# Patient Record
Sex: Male | Born: 1960 | Race: White | Hispanic: No | Marital: Married | Smoking: Current every day smoker
Health system: Southern US, Community
[De-identification: ages and names within clinical notes are randomized; demographics above are authoritative.]

## PROBLEM LIST (undated history)

## (undated) ENCOUNTER — Ambulatory Visit (HOSPITAL_COMMUNITY)

## (undated) DIAGNOSIS — E079 Disorder of thyroid, unspecified: Secondary | ICD-10-CM

## (undated) DIAGNOSIS — J439 Emphysema, unspecified: Secondary | ICD-10-CM

## (undated) DIAGNOSIS — E785 Hyperlipidemia, unspecified: Secondary | ICD-10-CM

## (undated) HISTORY — DX: Hyperlipidemia, unspecified: E78.5

## (undated) HISTORY — DX: Disorder of thyroid, unspecified: E07.9

## (undated) HISTORY — PX: KNEE SURGERY: SHX244

---

## 2002-02-06 ENCOUNTER — Encounter: Admission: RE | Admit: 2002-02-06 | Discharge: 2002-02-06 | Payer: Self-pay | Admitting: Family Medicine

## 2002-02-06 ENCOUNTER — Encounter: Payer: Self-pay | Admitting: Family Medicine

## 2006-07-13 ENCOUNTER — Emergency Department (HOSPITAL_COMMUNITY): Admission: EM | Admit: 2006-07-13 | Discharge: 2006-07-13 | Payer: Self-pay | Admitting: Family Medicine

## 2006-09-14 ENCOUNTER — Ambulatory Visit: Payer: Self-pay | Admitting: Family Medicine

## 2006-10-10 ENCOUNTER — Ambulatory Visit: Payer: Self-pay | Admitting: Sports Medicine

## 2006-11-20 ENCOUNTER — Ambulatory Visit: Payer: Self-pay | Admitting: Family Medicine

## 2006-12-03 ENCOUNTER — Encounter: Payer: Self-pay | Admitting: Family Medicine

## 2006-12-03 ENCOUNTER — Ambulatory Visit: Payer: Self-pay | Admitting: Family Medicine

## 2006-12-03 LAB — CONVERTED CEMR LAB: TSH: 1.118 microintl units/mL (ref 0.350–5.50)

## 2007-02-28 ENCOUNTER — Telehealth: Payer: Self-pay | Admitting: *Deleted

## 2007-03-01 ENCOUNTER — Ambulatory Visit: Payer: Self-pay | Admitting: Family Medicine

## 2007-07-10 ENCOUNTER — Encounter (INDEPENDENT_AMBULATORY_CARE_PROVIDER_SITE_OTHER): Payer: Self-pay | Admitting: Family Medicine

## 2007-07-23 ENCOUNTER — Telehealth (INDEPENDENT_AMBULATORY_CARE_PROVIDER_SITE_OTHER): Payer: Self-pay | Admitting: Family Medicine

## 2007-08-28 ENCOUNTER — Ambulatory Visit: Payer: Self-pay | Admitting: Family Medicine

## 2007-08-28 ENCOUNTER — Encounter (INDEPENDENT_AMBULATORY_CARE_PROVIDER_SITE_OTHER): Payer: Self-pay | Admitting: Family Medicine

## 2007-08-28 DIAGNOSIS — E039 Hypothyroidism, unspecified: Secondary | ICD-10-CM | POA: Insufficient documentation

## 2007-08-28 DIAGNOSIS — E782 Mixed hyperlipidemia: Secondary | ICD-10-CM | POA: Insufficient documentation

## 2007-08-28 DIAGNOSIS — E7800 Pure hypercholesterolemia, unspecified: Secondary | ICD-10-CM | POA: Insufficient documentation

## 2007-08-28 DIAGNOSIS — F17210 Nicotine dependence, cigarettes, uncomplicated: Secondary | ICD-10-CM | POA: Insufficient documentation

## 2007-08-28 LAB — CONVERTED CEMR LAB
ALT: 14 units/L (ref 0–53)
AST: 17 units/L (ref 0–37)
Albumin: 4.3 g/dL (ref 3.5–5.2)
Alkaline Phosphatase: 89 units/L (ref 39–117)
BUN: 7 mg/dL (ref 6–23)
CO2: 25 meq/L (ref 19–32)
Calcium: 9.3 mg/dL (ref 8.4–10.5)
Chloride: 104 meq/L (ref 96–112)
Cholesterol: 241 mg/dL — ABNORMAL HIGH (ref 0–200)
Creatinine, Ser: 0.71 mg/dL (ref 0.40–1.50)
Glucose, Bld: 89 mg/dL (ref 70–99)
HCT: 42.5 % (ref 39.0–52.0)
HDL: 36 mg/dL — ABNORMAL LOW (ref >39)
Hemoglobin: 14.2 g/dL (ref 13.0–17.0)
LDL Cholesterol: 164 mg/dL — ABNORMAL HIGH (ref 0–99)
MCHC: 33.4 g/dL (ref 30.0–36.0)
MCV: 85.7 fL (ref 78.0–100.0)
Platelets: 254 10*3/uL (ref 150–400)
Potassium: 4.1 meq/L (ref 3.5–5.3)
RBC: 4.96 M/uL (ref 4.22–5.81)
RDW: 12.8 % (ref 11.5–14.0)
Sodium: 141 meq/L (ref 135–145)
TSH: 0.593 microintl units/mL (ref 0.350–5.50)
Total Bilirubin: 0.9 mg/dL (ref 0.3–1.2)
Total CHOL/HDL Ratio: 6.7
Total Protein: 6.7 g/dL (ref 6.0–8.3)
Triglycerides: 204 mg/dL — ABNORMAL HIGH (ref <150)
VLDL: 41 mg/dL — ABNORMAL HIGH (ref 0–40)
WBC: 7.6 10*3/uL (ref 4.0–10.5)

## 2007-08-29 ENCOUNTER — Telehealth: Payer: Self-pay | Admitting: *Deleted

## 2007-08-29 ENCOUNTER — Encounter (INDEPENDENT_AMBULATORY_CARE_PROVIDER_SITE_OTHER): Payer: Self-pay | Admitting: Family Medicine

## 2007-09-03 ENCOUNTER — Telehealth (INDEPENDENT_AMBULATORY_CARE_PROVIDER_SITE_OTHER): Payer: Self-pay | Admitting: Family Medicine

## 2007-12-04 ENCOUNTER — Ambulatory Visit: Payer: Self-pay | Admitting: Family Medicine

## 2007-12-04 ENCOUNTER — Encounter (INDEPENDENT_AMBULATORY_CARE_PROVIDER_SITE_OTHER): Payer: Self-pay | Admitting: Family Medicine

## 2007-12-04 LAB — CONVERTED CEMR LAB
Cholesterol, target level: 200 mg/dL
Cholesterol: 293 mg/dL — ABNORMAL HIGH (ref 0–200)
HDL goal, serum: 40 mg/dL
HDL: 41 mg/dL (ref >39)
LDL Cholesterol: 181 mg/dL — ABNORMAL HIGH (ref 0–99)
LDL Goal: 130 mg/dL
Total CHOL/HDL Ratio: 7.1
Triglycerides: 357 mg/dL — ABNORMAL HIGH (ref <150)
VLDL: 71 mg/dL — ABNORMAL HIGH (ref 0–40)

## 2007-12-05 ENCOUNTER — Encounter (INDEPENDENT_AMBULATORY_CARE_PROVIDER_SITE_OTHER): Payer: Self-pay | Admitting: Family Medicine

## 2007-12-10 ENCOUNTER — Telehealth (INDEPENDENT_AMBULATORY_CARE_PROVIDER_SITE_OTHER): Payer: Self-pay | Admitting: Family Medicine

## 2008-01-02 ENCOUNTER — Encounter (INDEPENDENT_AMBULATORY_CARE_PROVIDER_SITE_OTHER): Payer: Self-pay | Admitting: Family Medicine

## 2008-01-03 ENCOUNTER — Encounter (INDEPENDENT_AMBULATORY_CARE_PROVIDER_SITE_OTHER): Payer: Self-pay | Admitting: *Deleted

## 2008-02-25 ENCOUNTER — Ambulatory Visit: Payer: Self-pay | Admitting: Family Medicine

## 2008-03-31 ENCOUNTER — Telehealth: Payer: Self-pay | Admitting: *Deleted

## 2008-10-13 ENCOUNTER — Ambulatory Visit: Payer: Self-pay | Admitting: Family Medicine

## 2008-10-13 ENCOUNTER — Telehealth: Payer: Self-pay | Admitting: *Deleted

## 2008-10-14 ENCOUNTER — Encounter (INDEPENDENT_AMBULATORY_CARE_PROVIDER_SITE_OTHER): Payer: Self-pay | Admitting: *Deleted

## 2008-10-14 ENCOUNTER — Telehealth: Payer: Self-pay | Admitting: Family Medicine

## 2008-10-16 ENCOUNTER — Encounter: Payer: Self-pay | Admitting: Family Medicine

## 2008-10-16 ENCOUNTER — Telehealth (INDEPENDENT_AMBULATORY_CARE_PROVIDER_SITE_OTHER): Payer: Self-pay | Admitting: *Deleted

## 2009-01-01 ENCOUNTER — Telehealth: Payer: Self-pay | Admitting: Family Medicine

## 2009-02-09 ENCOUNTER — Ambulatory Visit: Payer: Self-pay | Admitting: Family Medicine

## 2009-02-09 ENCOUNTER — Encounter: Payer: Self-pay | Admitting: Family Medicine

## 2009-02-09 LAB — CONVERTED CEMR LAB
ALT: 14 units/L (ref 0–53)
AST: 16 units/L (ref 0–37)
Albumin: 4.1 g/dL (ref 3.5–5.2)
Alkaline Phosphatase: 101 units/L (ref 39–117)
BUN: 9 mg/dL (ref 6–23)
CO2: 27 meq/L (ref 19–32)
Calcium: 9 mg/dL (ref 8.4–10.5)
Chloride: 105 meq/L (ref 96–112)
Cholesterol: 260 mg/dL — ABNORMAL HIGH (ref 0–200)
Creatinine, Ser: 0.76 mg/dL (ref 0.40–1.50)
Glucose, Bld: 91 mg/dL (ref 70–99)
HDL: 38 mg/dL — ABNORMAL LOW (ref >39)
LDL Cholesterol: 175 mg/dL — ABNORMAL HIGH (ref 0–99)
Potassium: 4.4 meq/L (ref 3.5–5.3)
Sodium: 144 meq/L (ref 135–145)
TSH: 0.346 microintl units/mL — ABNORMAL LOW (ref 0.350–4.500)
Total Bilirubin: 0.7 mg/dL (ref 0.3–1.2)
Total CHOL/HDL Ratio: 6.8
Total Protein: 6.7 g/dL (ref 6.0–8.3)
Triglycerides: 233 mg/dL — ABNORMAL HIGH (ref <150)
VLDL: 47 mg/dL — ABNORMAL HIGH (ref 0–40)

## 2009-02-12 ENCOUNTER — Ambulatory Visit: Payer: Self-pay | Admitting: Family Medicine

## 2009-02-12 DIAGNOSIS — H9319 Tinnitus, unspecified ear: Secondary | ICD-10-CM | POA: Insufficient documentation

## 2009-04-28 ENCOUNTER — Emergency Department (HOSPITAL_COMMUNITY): Admission: EM | Admit: 2009-04-28 | Discharge: 2009-04-28 | Payer: Self-pay | Admitting: Emergency Medicine

## 2009-10-25 ENCOUNTER — Ambulatory Visit: Payer: Self-pay | Admitting: Family Medicine

## 2009-10-25 ENCOUNTER — Encounter: Payer: Self-pay | Admitting: Family Medicine

## 2009-10-25 LAB — CONVERTED CEMR LAB
ALT: 20 units/L (ref 0–53)
AST: 18 units/L (ref 0–37)
Albumin: 4.4 g/dL (ref 3.5–5.2)
Alkaline Phosphatase: 86 units/L (ref 39–117)
BUN: 9 mg/dL (ref 6–23)
CO2: 28 meq/L (ref 19–32)
Calcium: 9.8 mg/dL (ref 8.4–10.5)
Chloride: 105 meq/L (ref 96–112)
Creatinine, Ser: 0.73 mg/dL (ref 0.40–1.50)
Direct LDL: 198 mg/dL — ABNORMAL HIGH
Glucose, Bld: 86 mg/dL (ref 70–99)
HCT: 44.1 % (ref 39.0–52.0)
Hemoglobin: 14.8 g/dL (ref 13.0–17.0)
MCHC: 33.6 g/dL (ref 30.0–36.0)
MCV: 85 fL (ref 78.0–100.0)
Platelets: 243 10*3/uL (ref 150–400)
Potassium: 4.5 meq/L (ref 3.5–5.3)
RBC: 5.19 M/uL (ref 4.22–5.81)
RDW: 12.7 % (ref 11.5–15.5)
Sodium: 143 meq/L (ref 135–145)
TSH: 0.255 microintl units/mL — ABNORMAL LOW (ref 0.350–4.500)
Total Bilirubin: 0.9 mg/dL (ref 0.3–1.2)
Total Protein: 6.7 g/dL (ref 6.0–8.3)
WBC: 8.7 10*3/uL (ref 4.0–10.5)

## 2009-10-26 ENCOUNTER — Encounter: Payer: Self-pay | Admitting: Family Medicine

## 2009-11-24 ENCOUNTER — Telehealth: Payer: Self-pay | Admitting: Family Medicine

## 2010-03-08 ENCOUNTER — Ambulatory Visit: Payer: Self-pay | Admitting: Family Medicine

## 2010-05-03 ENCOUNTER — Ambulatory Visit: Payer: Self-pay | Admitting: Family Medicine

## 2010-05-03 ENCOUNTER — Encounter: Payer: Self-pay | Admitting: Family Medicine

## 2010-05-03 DIAGNOSIS — R319 Hematuria, unspecified: Secondary | ICD-10-CM | POA: Insufficient documentation

## 2010-05-03 LAB — CONVERTED CEMR LAB
Bilirubin Urine: NEGATIVE
Blood in Urine, dipstick: NEGATIVE
Glucose, Urine, Semiquant: NEGATIVE
Ketones, urine, test strip: NEGATIVE
Nitrite: NEGATIVE
Protein, U semiquant: NEGATIVE
Specific Gravity, Urine: 1.01
TSH: 0.139 microintl units/mL — ABNORMAL LOW (ref 0.350–4.500)
Urobilinogen, UA: 0.2
WBC Urine, dipstick: NEGATIVE
pH: 6.5

## 2010-05-04 ENCOUNTER — Encounter: Payer: Self-pay | Admitting: Family Medicine

## 2010-05-04 ENCOUNTER — Telehealth: Payer: Self-pay | Admitting: Family Medicine

## 2010-11-29 NOTE — Letter (Signed)
Summary: Out of Work  [facility]  [address]   [place], [place] [address]   Phone: [phone]  Fax: [phone]    Mar 08, 2010   Employee:  Joshua Mcknight    To Whom It May Concern:   Mr. Joshua Mcknight was seen in this office today for a medical illness. He is advised to remain out of work until Thursday, May 12th, when he may resume all his usual activities (including work).  If you need additional information, please feel free to contact our office.         Sincerely,    Paula Compton MD

## 2010-11-29 NOTE — Miscellaneous (Signed)
  Clinical Lists Changes  Medications: Changed medication from SYNTHROID 125 MCG TABS (LEVOTHYROXINE SODIUM) Take 1 tablet by mouth once a day [BMN] to SYNTHROID 100 MCG TABS (LEVOTHYROXINE SODIUM) Take 1 tab by mouth daily - Signed Rx of SYNTHROID 100 MCG TABS (LEVOTHYROXINE SODIUM) Take 1 tab by mouth daily;  #30 x 3;  Signed;  Entered by: Angelena Sole MD;  Authorized by: Angelena Sole MD;  Method used: Electronically to [facility] [id number], [place], [place], [place]  [address], Ph: [phone], Fax: [phone]    Prescriptions: SYNTHROID 100 MCG TABS (LEVOTHYROXINE SODIUM) Take 1 tab by mouth daily  #30 x 3   Entered and Authorized by:   Angelena Sole MD   Signed by:   Angelena Sole MD on 05/04/2010   Method used:   Electronically to        [facility] [id number] (retail)       [address]       [place], [place]  [address]       Ph: [phone]       Fax: [phone]   RxID:   [id number]

## 2010-11-29 NOTE — Progress Notes (Signed)
Summary: triage  Phone Note Call from Patient Call back at Home Phone [phone]   Caller: spouse-Donna Summary of Call: Wife says that she just got back from the Dr. and they suggested that her husband's Dr. call in [place] as a precaution for him. Initial call taken by: Clydell Hakim,  November 24, 2009 9:37 AM  Follow-up for Phone Call        uses [facility] [address]. she has the flu .started with HA monday. Dr. Larina Bras suggested her husband get Tamiflu. he woke up with a HA today. he went to work. asked that his pcp do this Follow-up by: Golden Circle RN,  November 24, 2009 9:45 AM  Additional Follow-up for Phone Call Additional follow up Details #1::        Spoke with pt's wife. She has flu. Husband has early symptoms including headache and congestion. Given exposure will treat.  Additional Follow-up by: Myrtie Soman  MD,  November 24, 2009 10:58 AM    New/Updated Medications: TAMIFLU 75 MG CAPS (OSELTAMIVIR PHOSPHATE) one by mouth daily for 10 days Prescriptions: TAMIFLU 75 MG CAPS (OSELTAMIVIR PHOSPHATE) one by mouth daily for 10 days  #10 x 0   Entered and Authorized by:   Myrtie Soman  MD   Signed by:   Myrtie Soman  MD on 11/24/2009   Method used:   Electronically to        [facility] [id number] (retail)       [address] [place], [place]  [address]       Ph: [phone]       Fax: [phone]   RxID:   [id number]

## 2010-11-29 NOTE — Assessment & Plan Note (Signed)
Summary: flu symptoms,tcb   Vital Signs:  Patient profile:   50 year old male Height:      70.5 inches Weight:      154 pounds BMI:     21.86 Temp:     98.4 degrees F oral Pulse rate:   81 / minute BP sitting:   107 / 78  (right arm) Cuff size:   regular  Vitals Entered By: Tessie Fass CMA (Mar 08, 2010 12:00 PM) CC: Flu symptoms x 2 days Is Patient Diabetic? No Pain Assessment Patient in pain? no        Primary Care Provider:  Myrtie Soman  MD  CC:  Flu symptoms x 2 days.  History of Present Illness: Mr Till comes in today for acute visit, complaint of "flu like symptoms" including backache, mild headache, and fever to 102F since Sunday, May 8th .  Started when he woke up on 5/08, gradually has gotten worse.  Similar sxs occur about once a year for him, and then resolve over several days.  Usually this happens in the fall. In reviewing his notes, I see he was prescribed Tamiflu over the phone in Jan of this year; he has little  recollection of that episode of illness.   Describes main symptoms as soreness and backache; has had some watery runny nose, but not much in the way of cough except for once-daily clearning of throat when he wakes up.   Continues to smoke about 1 ppd, has taken Chantix in the past but did not find it very helpful.    Sick contacts: mother with bronchitis, he saw her on Saturday (the day before the beginning of this episode of illness).  Habits & Providers  Alcohol-Tobacco-Diet     Tobacco Status: current     Tobacco Counseling: to quit use of tobacco products     Cigarette Packs/Day: 1.0  Current Medications (verified): 1)  Synthroid 125 Mcg Tabs (Levothyroxine Sodium) .... Take 1 Tablet By Mouth Once A Day 2)  Pravastatin Sodium 40 Mg Tabs (Pravastatin Sodium) .Marland Kitchen.. 1 By Mouth Once Daily For Cholesterol. 3)  Tamiflu 75 Mg Caps (Oseltamivir Phosphate) .... One By Mouth Daily For 10 Days  Allergies (verified): No Known Drug  Allergies  Physical Exam  General:  Generally well appearing, no apparent distress Eyes:  clear sclerae. no injected conjunctivae Ears:  TMs clear bilaterally.  Nose:  No sinus tenderness.  Mildly watery nasal mucosa, no purulence or blood.  Mouth:  Moist buccal membranes.  Oropharynx without exudates, but with some cobblestoning Neck:  Neck supple.  No anterior cervical adenopathy.  Lungs:  Clear lung fields bilaterally. No rales or wheezes.  Heart:  Regular S1S2.  Abdomen:  Soft, nontender   Impression & Recommendations:  Problem # 1:  UPPER RESPIRATORY INFECTION, ACUTE (ICD-465.9)  Discussed likelihood that this is a self-limited viral illness; nothing to suggest that he would have pneumonia or a more severe infectious process.  Discussed expected course, supportive measures, and when he should seek additional care if not improved or if worsening.   Orders: [facility]- Est Level  3 ([address])  Problem # 2:  TOBACCO ABUSE (ICD-305.1)  Disussed reasons why tobacco cessation is advisable.  He is aware of this, counseled during this visit.  Orders: [facility]- Est Level  3 ([address])  Complete Medication List: 1)  Synthroid 125 Mcg Tabs (Levothyroxine sodium) .... Take 1 tablet by mouth once a day 2)  Pravastatin Sodium 40 Mg Tabs (Pravastatin sodium) .Marland KitchenMarland KitchenMarland Kitchen  1 by mouth once daily for cholesterol. 3)  Tamiflu 75 Mg Caps (Oseltamivir phosphate) .... One by mouth daily for 10 days  Patient Instructions: 1)  It was a pleasure to see you today.  I believe you have a viral respiratory infection, not the flu.  2)  I recommend that you take an anti-inflammatory medication, such as Ibuprofen 200mg  tabs (over-the-counter), take 2 to 4 tablets by mouth every 6 to 8 hours as needed for backache, headache, and fever.  I do not think you should need this for much more than a couple of days.  3)  You may use nasal saline spray ([facility] Spray), to irrigate your nose.  Also, Mucinex DM 1 tablet by mouth twice daily to  loosen phlegm, especially in the mornings.  4)  As always, we are glad to continue to discuss ways to help you quit smoking.  Smokers are more likely to get this kind of infection more often, and for longer.

## 2010-11-29 NOTE — Letter (Signed)
Summary: Generic Letter  [facility]  [address]   [place], [place] [address]   Phone: [phone]  Fax: [phone]    05/04/2010  Joshua Mcknight [address] [place], [place]  [address]  Dear [name],  Here is a copy of your lab results.  Your TSH is too low, so we need to decrease the Synthroid dose.  Please start taking instead of .  I have sent in a new prescription to your pharmacy.  Please call the office if you have any questions.  Tests: (1) TSH ([address])   TSH                  [L]  0.139 uIU/mL            Normal  0.350-4.500     Sincerely,   Angelena Sole MD  Appended Document: Generic Letter mailed

## 2010-11-29 NOTE — Progress Notes (Signed)
  Phone Note Outgoing Call   Call placed by: Angelena Sole MD,  May 04, 2010 1:43 PM Summary of Call: LM about lab results

## 2010-11-29 NOTE — Assessment & Plan Note (Signed)
Summary: brown urine,df   Vital Signs:  Patient profile:   50 year old male Height:      70.5 inches Weight:      157.2 pounds BMI:     22.32 Temp:     98.0 degrees F oral Pulse rate:   68 / minute BP sitting:   131 / 72  (left arm) Cuff size:   regular  Vitals Entered By: Garen Grams LPN (May 03, 1609 9:28 AM) CC: cloudy urine Is Patient Diabetic? No Pain Assessment Patient in pain? no        Primary Care Provider:  Myrtie Soman  MD  CC:  cloudy urine.  History of Present Illness: 1. Cloudy urine:  Pt had 2 days of "dark, cloudy" urine.  This was on Mon and Tues and since then it has cleared up.  This happened one other time last year in the summer.  He has been working hard for the past week.  He had a little bit of lower abdominal discomfort but no pain.      ROS: denies fevers, hematuria, dysuria, abdominal pain, back pain  2. Hypothyroidism:  Hasn't had TSH checked in a while.  He is taking the synthroid as prescribed.      ROS: endorses some fatigue  3. Tobacco Use:  He continues to smoke around 1 pack / day.  He has no interest in cutting down.  He is aware of the risks      ROS: denies chest pain, shortness of breath, chronic cough, chills  Habits & Providers  Alcohol-Tobacco-Diet     Tobacco Status: current     Tobacco Counseling: to quit use of tobacco products     Cigarette Packs/Day: 1.0  Current Medications (verified): 1)  Synthroid 125 Mcg Tabs (Levothyroxine Sodium) .... Take 1 Tablet By Mouth Once A Day 2)  Pravastatin Sodium 40 Mg Tabs (Pravastatin Sodium) .Marland Kitchen.. 1 By Mouth Once Daily For Cholesterol. 3)  Tamiflu 75 Mg Caps (Oseltamivir Phosphate) .... One By Mouth Daily For 10 Days  Allergies: No Known Drug Allergies  Past History:  Risk Factors: Smoking Status: current (05/03/2010) Packs/Day: 1.0 (05/03/2010)  Past Medical History: Reviewed history from 12/27/2006 and no changes required. ABI>1 B, complete thyroid ablation in distant  past, febrile sz as child, knee fx at 25 y/o, mult calluses on both feet  Social History: Reviewed history from 02/12/2009 and no changes required. 35 pack year tob. Rare Etoh, distant h/o cocaine, THC, acid.  No injection drug use.  Former Chartered certified accountant, now works for [employer].   Married to [employer].  Daughter in law and her child also live at home.  Remote ETOH use.   Physical Exam  General:  Generally well appearing, no apparent distress Head:  normocephalic and atraumatic.   Eyes:  clear sclerae. no injected conjunctivae Mouth:  fair dentition.   Neck:  Neck supple.  No anterior cervical adenopathy.  no masses Lungs:  Clear lung fields bilaterally. No rales or wheezes.  Heart:  Regular S1S2.  Abdomen:  Soft, nontender.  Normal bowel sounds.  no guarding or rebound tenderness. Extremities:  no cyanosis, clubbing, or edema  Skin:  turgor normal, color normal, no rashes, and no suspicious lesions.   Psych:  good eye contact, not anxious appearing, and not depressed appearing.     Impression & Recommendations:  Problem # 1:  HEMATURIA UNSPECIFIED (ICD-599.70) Assessment Unchanged No signs of blood or infection in his urine.  Advised him  to collect it if it happens again for sampling. 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 His updated medication list for this problem includes:    Synthroid 125 Mcg Tabs (Levothyroxine sodium) .Marland Kitchen... Take 1 tablet by mouth once a day  Orders: TSH-[facility] ([address]) [facility]- Est  Level 4 ([address])  Problem # 3:  TOBACCO ABUSE (ICD-305.1) Assessment: Unchanged  Advised him to quit smoking.  Not interested at this point.  Orders: [facility]- Est  Level 4 ([address])  Complete Medication List: 1)  Synthroid 125 Mcg Tabs (Levothyroxine sodium) .... Take 1 tablet by mouth once a day  Laboratory Results   Urine Tests  Date/Time  Received: May 03, 2010 9:26 AM  Date/Time Reported: May 03, 2010 9:35 AM   Routine Urinalysis   Color: straw Appearance: Clear Glucose: negative   (Normal Range: Negative) Bilirubin: negative   (Normal Range: Negative) Ketone: negative   (Normal Range: Negative) Spec. Gravity: 1.010   (Normal Range: 1.003-1.035) Blood: negative   (Normal Range: Negative) pH: 6.5   (Normal Range: 5.0-8.0) Protein: negative   (Normal Range: Negative) Urobilinogen: 0.2   (Normal Range: 0-1) Nitrite: negative   (Normal Range: Negative) Leukocyte Esterace: negative   (Normal Range: Negative)    Comments: ...............test performed by......Marland KitchenBonnie A. [place], MLS (ASCP)cm     Prevention & Chronic Care Immunizations   Influenza vaccine: Not documented    Tetanus booster: 09/05/2006: Done.   Tetanus booster due: 09/05/2016    Pneumococcal vaccine: Pneumovax  (08/28/2007)   Pneumococcal vaccine due: None  Colorectal Screening   Hemoccult: Not documented    Colonoscopy: Not documented  Other Screening   PSA: Not documented   Smoking status: current  (05/03/2010)   Smoking cessation counseling: yes  (05/03/2010)  Lipids   Total Cholesterol: 260  (02/09/2009)   LDL: 175  (02/09/2009)   LDL Direct: 198  (10/25/2009)   HDL: 38  (02/09/2009)   Triglycerides: 233  (02/09/2009)    SGOT (AST): 18  (10/25/2009)   SGPT (ALT): 20  (10/25/2009)   Alkaline phosphatase: 86  (10/25/2009)   Total bilirubin: 0.9  (10/25/2009)    Lipid flowsheet reviewed?: Yes   Progress toward LDL goal: Unchanged  Self-Management Support :   Personal Goals (by the next clinic visit) :      Personal LDL goal: 160  (10/25/2009)    Lipid self-management support: Written self-care plan, Education handout  (10/25/2009)     Lipid self-management support not done because: Refused  (10/25/2009)

## 2010-12-13 ENCOUNTER — Ambulatory Visit (INDEPENDENT_AMBULATORY_CARE_PROVIDER_SITE_OTHER): Payer: Managed Care, Other (non HMO) | Admitting: Family Medicine

## 2010-12-13 ENCOUNTER — Encounter: Payer: Self-pay | Admitting: Family Medicine

## 2010-12-13 VITALS — BP 115/74 | HR 73 | Temp 98.0°F | Wt 156.0 lb

## 2010-12-13 DIAGNOSIS — H10029 Other mucopurulent conjunctivitis, unspecified eye: Secondary | ICD-10-CM | POA: Insufficient documentation

## 2010-12-13 MED ORDER — POLYMYXIN B-TRIMETHOPRIM 10000-0.1 UNIT/ML-% OP SOLN: 2.0000 [drp] | Freq: Four times a day (QID) | OPHTHALMIC | Status: AC

## 2010-12-13 NOTE — Assessment & Plan Note (Signed)
Does not look like significant drainage currently but given granddaughter exposure and unable to go to work until cleared up, will give abx today.  Gave red flags for RTC.

## 2010-12-13 NOTE — Patient Instructions (Signed)
It was nice to meet you.  Please come back if your eye gets worse or you have any vision changes or pain After 3 days, you can decrease the drops to 2 times a day if you see improvement

## 2010-12-13 NOTE — Progress Notes (Signed)
  Subjective:    Patient ID: Joshua Mcknight, male    DOB: December 25, 1960, 50 y.o.   MRN: [id number]  HPI Pink eye- woke up this am with crusted right eye.  Some irritation and redness.  Grand daughter dx with conjunctivitis and given abx yesterday.  ROS No vision changes, no HA, no eye pain.     Review of Systems     Objective:   Physical Exam  Constitutional: He appears well-developed and well-nourished.  Eyes: EOM are normal. Pupils are equal, round, and reactive to light. Right eye exhibits no discharge and no exudate. Right conjunctiva is injected.          Assessment & Plan:

## 2010-12-29 ENCOUNTER — Other Ambulatory Visit: Payer: Self-pay | Admitting: Family Medicine

## 2010-12-29 NOTE — Telephone Encounter (Signed)
Refill request

## 2011-09-12 ENCOUNTER — Other Ambulatory Visit: Payer: Self-pay | Admitting: Family Medicine

## 2011-09-12 NOTE — Telephone Encounter (Signed)
Called and left message on home voicemail to call office for appointment before next refill needed.  Refilled for one month.

## 2011-09-18 ENCOUNTER — Encounter: Payer: Self-pay | Admitting: Family Medicine

## 2011-09-18 ENCOUNTER — Ambulatory Visit (INDEPENDENT_AMBULATORY_CARE_PROVIDER_SITE_OTHER): Payer: Managed Care, Other (non HMO) | Admitting: Family Medicine

## 2011-09-18 VITALS — BP 120/80 | HR 72 | Temp 97.9°F | Ht 70.5 in | Wt 147.5 lb

## 2011-09-18 DIAGNOSIS — Z0000 Encounter for general adult medical examination without abnormal findings: Secondary | ICD-10-CM | POA: Insufficient documentation

## 2011-09-18 DIAGNOSIS — F17200 Nicotine dependence, unspecified, uncomplicated: Secondary | ICD-10-CM

## 2011-09-18 DIAGNOSIS — E7800 Pure hypercholesterolemia, unspecified: Secondary | ICD-10-CM

## 2011-09-18 DIAGNOSIS — E890 Postprocedural hypothyroidism: Secondary | ICD-10-CM

## 2011-09-18 LAB — TSH: TSH: 10.066 u[IU]/mL — ABNORMAL HIGH (ref 0.350–4.500)

## 2011-09-18 LAB — LIPID PANEL
Cholesterol: 262 mg/dL — ABNORMAL HIGH (ref 0–200)
HDL: 46 mg/dL (ref >39)
Total CHOL/HDL Ratio: 5.7 Ratio
Triglycerides: 122 mg/dL (ref <150)

## 2011-09-18 NOTE — Assessment & Plan Note (Signed)
Patient refuses colonoscopy and flu shot today.

## 2011-09-18 NOTE — Assessment & Plan Note (Signed)
Will check lipids today, discuss need for medication with patient when results are back.

## 2011-09-18 NOTE — Progress Notes (Signed)
  Subjective:    Patient ID: Joshua Mcknight, male    DOB: 03-14-1961, 49 y.o.   MRN: [id number]  HPI  Joshua Mcknight comes in to have his thyroid checked.  He says he has been on the same dose of synthroid for 20 years and is wondering if he could come just once a year to have it monitored instead of every 6 months.   He smokes about 1 ppd, and he says he has no interest in quitting.  He knows it may give him cancer but he says he believes when his time comes that is his time.    He says he had his cholesterol checked at work and it was better than the last time it was checked here.  He agrees to have that checked, but says if it is under 250 he's not worried about it and will refuse to take medicines.  Health maintenance- patient refuses colonoscopy, saying he had one 15 or 20 years ago and it was horrible.  He also refuses heme-occult stool cards, saying if he has cancer he's not going to let them operate on him anyway.  Patient also refuses the flu shot, saying he will get the flu if he gets the shot.   Review of Systems  Constitutional: Negative for unexpected weight change.  Eyes: Negative for visual disturbance.  Respiratory: Negative for shortness of breath.   Cardiovascular: Negative for chest pain.  Gastrointestinal: Negative for blood in stool.  Musculoskeletal: Positive for arthralgias.  Skin: Negative for rash.  Neurological: Negative for weakness.  Hematological: Negative for adenopathy.  Psychiatric/Behavioral: Negative for dysphoric mood.       Objective:   Physical Exam BP 120/80  Pulse 72  Temp(Src) 97.9 F (36.6 C) (Oral)  Ht 5' 10.5" (1.791 m)  Wt 147 lb 8 oz (66.906 kg)  BMI 20.87 kg/m2 General appearance: alert, cooperative and no distress Eyes: conjunctivae/corneas clear. PERRL, EOM's intact. Fundi benign. Throat: oral mucosa moist, no lesions, poor dentition.  Neck: no adenopathy, no JVD, supple, symmetrical, trachea midline and thyroid not enlarged, symmetric,  no tenderness/mass/nodules Lungs: clear to auscultation bilaterally Heart: regular rate and rhythm, S1, S2 normal, no murmur, click, rub or gallop Abdomen: soft, non-tender; bowel sounds normal; no masses,  no organomegaly Pulses: 2+ and symmetric Neurologic: Grossly normal       Assessment & Plan:

## 2011-09-18 NOTE — Assessment & Plan Note (Signed)
Patient wants to decrease frequency of monitoring.  Discussed that if his dose remains stable, he can follow up in one year, but if needs adjusting will need to follow up sooner.  He agrees.

## 2011-09-18 NOTE — Assessment & Plan Note (Signed)
Patient is in pre-contemplative stage for quitting.  He is not interested and accepts the risks smoking poses to his health.

## 2011-09-18 NOTE — Patient Instructions (Signed)
It was nice to meet you.  I will send you a letter or call you with your lab results.  If you have any problems, please call the office.  If your Synthroid dose remains stable, we will see you back in one year.  If this lab test shows we need to change the dose, we will need to see you again sooner.

## 2011-09-25 ENCOUNTER — Telehealth: Payer: Self-pay | Admitting: Family Medicine

## 2011-09-25 ENCOUNTER — Encounter: Payer: Self-pay | Admitting: Family Medicine

## 2011-09-25 MED ORDER — LEVOTHYROXINE SODIUM 150 MCG PO TABS: 150.0000 ug | ORAL_TABLET | Freq: Every day | ORAL | Status: DC

## 2011-09-25 NOTE — Telephone Encounter (Signed)
Called to notify pt that TSH high, needs increased dose of synthroid.  rx sent to pharmacy.  Also, cholesterol is high, advised diet and exercise, and said would discuss starting a mediation.  Pt is to follow up in 3 months.

## 2012-01-24 ENCOUNTER — Other Ambulatory Visit: Payer: Self-pay | Admitting: Family Medicine

## 2012-02-08 ENCOUNTER — Encounter: Payer: Self-pay | Admitting: Internal Medicine

## 2012-02-08 ENCOUNTER — Ambulatory Visit (INDEPENDENT_AMBULATORY_CARE_PROVIDER_SITE_OTHER): Payer: Managed Care, Other (non HMO) | Admitting: Family Medicine

## 2012-02-08 ENCOUNTER — Encounter: Payer: Self-pay | Admitting: Family Medicine

## 2012-02-08 VITALS — BP 104/65 | HR 70 | Temp 98.1°F | Ht 70.5 in | Wt 143.8 lb

## 2012-02-08 DIAGNOSIS — E890 Postprocedural hypothyroidism: Secondary | ICD-10-CM

## 2012-02-08 DIAGNOSIS — E7800 Pure hypercholesterolemia, unspecified: Secondary | ICD-10-CM

## 2012-02-08 DIAGNOSIS — R634 Abnormal weight loss: Secondary | ICD-10-CM

## 2012-02-08 LAB — CBC WITH DIFFERENTIAL/PLATELET
Basophils Absolute: 0.1 10*3/uL (ref 0.0–0.1)
Eosinophils Absolute: 0.2 10*3/uL (ref 0.0–0.7)
Eosinophils Relative: 2 % (ref 0–5)
HCT: 43.7 % (ref 39.0–52.0)
MCH: 27.8 pg (ref 26.0–34.0)
MCV: 86 fL (ref 78.0–100.0)
Monocytes Absolute: 0.6 10*3/uL (ref 0.1–1.0)
Platelets: 316 10*3/uL (ref 150–400)
RDW: 13.5 % (ref 11.5–15.5)

## 2012-02-08 MED ORDER — SIMVASTATIN 40 MG PO TABS: 40.0000 mg | ORAL_TABLET | Freq: Every day | ORAL | Status: DC

## 2012-02-08 NOTE — Assessment & Plan Note (Signed)
Patient overdue for colonoscopy.  GI referral made.  Will check CBC and TSH.

## 2012-02-08 NOTE — Patient Instructions (Signed)
It was good to see you.  I will send you a letter with your labs.  Please start taking simvastatin, the cholesterol medication daily.    I am referring you to Gastroenterology for your screening colonoscopy, the office will contact you with the appointment.

## 2012-02-08 NOTE — Assessment & Plan Note (Signed)
Re-check TSH to ensure new dose of Synthroid appropriate- ensure not over-treating (causing weight loss).

## 2012-02-08 NOTE — Assessment & Plan Note (Signed)
Discussed risks and benefits of statin therapy.  I strongly advised starting simvastatin, which he agreed to, and rx written.

## 2012-02-08 NOTE — Progress Notes (Signed)
  Subjective:    Patient ID: Joshua Mcknight, male    DOB: Sep 25, 1961, 51 y.o.   MRN: [id number]  HPI  Mr. Holte comes in for follow up.  His TSH was elevated last visit, although he had been on a stable dose of synthroid for many years.  He has started taking the new dose of synthroid.  However he does endorse some weight loss- but this has been going on for >6 months, before his thyroid medication was changed.  He denies palpitations, vision changes, chest pain, or dyspnea.   His cholesterol was also evevated (total  = 262, LDL 191).  Patient is very resistant to being on a cholesterol pill, because he took them years ago.   He says he has lost weight over the past few years unintentionally.  He denies fatigue, denies cough, dyspnea, blood in stool.  He had a colonoscopy about 20 years ago when he had blood in his stool and is resistant to having one done again.   Review of Systems Pertinent Items in HPI    Objective:   Physical Exam BP 104/65  Pulse 70  Temp(Src) 98.1 F (36.7 C) (Oral)  Ht 5' 10.5" (1.791 m)  Wt 143 lb 12.8 oz (65.227 kg)  BMI 20.34 kg/m2 General appearance: alert, cooperative and no distress Eyes: conjunctivae/corneas clear. PERRL, EOM's intact. Fundi benign. Throat: lips, mucosa, and tongue normal; teeth and gums normal Neck: no adenopathy, no carotid bruit, no JVD, supple, symmetrical, trachea midline and thyroid not enlarged, symmetric, no tenderness/mass/nodules Lungs: clear to auscultation bilaterally Heart: regular rate and rhythm, S1, S2 normal, no murmur, click, rub or gallop Abdomen: soft, non-tender; bowel sounds normal; no masses,  no organomegaly Extremities: extremities normal, atraumatic, no cyanosis or edema Pulses: 2+ and symmetric Skin: Skin color, texture, turgor normal. No rashes or lesions       Assessment & Plan:

## 2012-02-12 ENCOUNTER — Encounter: Payer: Self-pay | Admitting: Family Medicine

## 2012-02-26 ENCOUNTER — Other Ambulatory Visit: Payer: Self-pay | Admitting: Family Medicine

## 2012-02-26 MED ORDER — LEVOTHYROXINE SODIUM 150 MCG PO TABS: 150.0000 ug | ORAL_TABLET | Freq: Every day | ORAL | Status: DC

## 2012-02-27 ENCOUNTER — Ambulatory Visit: Payer: Managed Care, Other (non HMO) | Admitting: Internal Medicine

## 2012-08-25 ENCOUNTER — Encounter (HOSPITAL_COMMUNITY): Payer: Self-pay | Admitting: *Deleted

## 2012-08-25 ENCOUNTER — Emergency Department (INDEPENDENT_AMBULATORY_CARE_PROVIDER_SITE_OTHER)
Admission: EM | Admit: 2012-08-25 | Discharge: 2012-08-25 | Disposition: A | Discharge status: Home / Self Care | Payer: Managed Care, Other (non HMO) | Source: Home / Self Care

## 2012-08-25 DIAGNOSIS — E039 Hypothyroidism, unspecified: Secondary | ICD-10-CM

## 2012-08-25 DIAGNOSIS — S61411A Laceration without foreign body of right hand, initial encounter: Secondary | ICD-10-CM

## 2012-08-25 DIAGNOSIS — G4700 Insomnia, unspecified: Secondary | ICD-10-CM

## 2012-08-25 DIAGNOSIS — S61409A Unspecified open wound of unspecified hand, initial encounter: Secondary | ICD-10-CM

## 2012-08-25 MED ORDER — CLINDAMYCIN HCL 300 MG PO CAPS: 300.0000 mg | ORAL_CAPSULE | Freq: Three times a day (TID) | ORAL | Status: DC

## 2012-08-25 MED ORDER — IBUPROFEN 800 MG PO TABS: 800.0000 mg | ORAL_TABLET | Freq: Four times a day (QID) | ORAL | Status: DC | PRN

## 2012-08-25 MED ORDER — TETANUS-DIPHTHERIA TOXOIDS TD 5-2 LFU IM INJ
0.5000 mL | INJECTION | Freq: Once | INTRAMUSCULAR | Status: AC
  Administered 2012-08-25: 0.5 mL via INTRAMUSCULAR

## 2012-08-25 MED ORDER — TETANUS-DIPHTH-ACELL PERTUSSIS 5-2.5-18.5 LF-MCG/0.5 IM SUSP
INTRAMUSCULAR | Status: AC
  Filled 2012-08-25: qty 0.5

## 2012-08-25 NOTE — ED Notes (Signed)
Pt reports right hand laceration that occurred at 3  Am with box knife _ right thumb. Bloody dressing removed and hand placed in betadine soak

## 2012-08-25 NOTE — ED Provider Notes (Signed)
History     CSN: [id number]  Arrival date & time 08/25/12  0920   First MD Initiated Contact with Patient 08/25/12 1003      Chief Complaint  Patient presents with  . Laceration    HPI 51 year old Caucasian male with known history oftobacco abuse, post ablative hypothyroidism presented to the urgent care Center at 08/25/2012 after cutting himself while trying to insulate his windows at [facility] a box night. He states that this happened at 3 AM in the morning. He states he does not sleep well and has been under significant stress because his wife of 21 years recently left him. He states that he has had no fever no chills no nausea no vomiting. He states that the bleeding stopped after 20 minutes of use of toilet paper. He came to the urgent care except before the won't be reviewed and to make sure that there was no other interventions like suturing that was needed.  History reviewed. No pertinent past medical history.  History reviewed. No pertinent past surgical history.  Family History  Problem Relation Age of Onset  . Family history unknown: Yes    History  Substance Use Topics  . Smoking status: Current Every Day Smoker -- 2.0 packs/day for 32 years    Types: Cigarettes  . Smokeless tobacco: Not on file  . Alcohol Use: No      Review of Systems Insomnia positive, no chest pain or shortness of breath no nausea no vomiting No diarrhea Does not think he is anxious Does have some evidence of situational stress. Has not been sleeping because of this.  Allergies  Review of patient's allergies indicates no known allergies.  Home Medications   Current Outpatient Rx  Name Route Sig Dispense Refill  . LEVOTHYROXINE SODIUM 150 MCG PO TABS Oral Take 1 tablet (150 mcg total) by mouth daily. 30 tablet 5  . SIMVASTATIN 40 MG PO TABS Oral Take 1 tablet (40 mg total) by mouth at bedtime. 90 tablet 3    BP 140/82  Pulse 87  Temp 98.2 F (36.8 C) (Oral)  Resp 20  SpO2  96%  Physical Exam Pleasant alert oriented Caucasian male no apparent distress Extremely poor dentition S1-S2 no murmur rub At clinically clear chest Right hand over the thenar eminence about 3 cm from the proximal interphalangeal joint shows a 1 cm gash which is about a quarter centimeter deep. It oozes slightly onpressure however it has stopped bleeding and looks clean.  ED Course  LACERATION REPAIR Date/Time: 08/25/2012 10:30 AM Performed by: Rhetta Mura Authorized by: Rhetta Mura Consent: Verbal consent obtained. Risks and benefits: risks, benefits and alternatives were discussed Consent given by: patient Patient understanding: patient states understanding of the procedure being performed Patient consent: the patient's understanding of the procedure matches consent given Procedure consent: procedure consent matches procedure scheduled Relevant documents: relevant documents present and verified Test results: test results available and properly labeled Site marked: the operative site was marked Imaging studies: imaging studies available Patient identity confirmed: verbally with patient Body area: upper extremity Location details: right hand Laceration length: 1 cm Foreign bodies: no foreign bodies Tendon involvement: none Nerve involvement: none Vascular damage: no Anesthesia: see MAR for details Patient sedated: no Preparation: Patient was prepped and draped in the usual sterile fashion. Irrigation solution: saline Amount of cleaning: standard Debridement: none Degree of undermining: none Approximation: close Approximation difficulty: simple Dressing: 4x4 sterile gauze Comments: Patient was Dermabond and 2 Steri-Strips were placed across it   (  including critical care time)  Labs Reviewed - No data to display No results found.   No diagnosis found.    MDM  A 51 year old male with post-ablative hypothyroidism presents with-to the right thenar  eminence without significant need for suturing-tetanus toxoid given., Aftercare instructions given including not to but the area for 3-5 days until seen by physician he patient given precautions about swelling oozing pus tenderness. Patient also given prophylactic antibiotics clindamycin for 5 days 3 times a day 300 mg, as wound does not appear to have been a very clean wound, and patient did not soak it after getting cut. See above for details 2) insomnia-patient has situational insomnia and has just separated from his wife-he states that she and him on according terms with his night sleep because of this-recommend psychiatry/psychology input if this persists beyond another month 3)Iatrogenic hypothyroid state-needs TSH probably in another one to 2 months          Rhetta Mura, MD 08/26/12 1341

## 2012-09-24 ENCOUNTER — Ambulatory Visit (INDEPENDENT_AMBULATORY_CARE_PROVIDER_SITE_OTHER): Payer: Managed Care, Other (non HMO) | Admitting: Family Medicine

## 2012-09-24 ENCOUNTER — Encounter: Payer: Self-pay | Admitting: Family Medicine

## 2012-09-24 VITALS — BP 142/83 | HR 80 | Ht 70.0 in | Wt 145.0 lb

## 2012-09-24 DIAGNOSIS — E890 Postprocedural hypothyroidism: Secondary | ICD-10-CM

## 2012-09-24 DIAGNOSIS — M545 Low back pain, unspecified: Secondary | ICD-10-CM

## 2012-09-24 DIAGNOSIS — M549 Dorsalgia, unspecified: Secondary | ICD-10-CM | POA: Insufficient documentation

## 2012-09-24 DIAGNOSIS — E7800 Pure hypercholesterolemia, unspecified: Secondary | ICD-10-CM

## 2012-09-24 DIAGNOSIS — M7989 Other specified soft tissue disorders: Secondary | ICD-10-CM

## 2012-09-24 DIAGNOSIS — M79604 Pain in right leg: Secondary | ICD-10-CM

## 2012-09-24 LAB — PRO B NATRIURETIC PEPTIDE: Pro B Natriuretic peptide (BNP): 67.88 pg/mL

## 2012-09-24 LAB — BASIC METABOLIC PANEL
BUN: 6 mg/dL (ref 6–23)
CO2: 29 mEq/L (ref 19–32)
Chloride: 100 mEq/L (ref 96–112)
Glucose, Bld: 83 mg/dL (ref 70–99)
Potassium: 4.4 mEq/L (ref 3.5–5.3)

## 2012-09-24 LAB — LIPID PANEL: Cholesterol: 241 mg/dL — ABNORMAL HIGH (ref 0–200)

## 2012-09-24 MED ORDER — SIMVASTATIN 40 MG PO TABS: 40.0000 mg | ORAL_TABLET | Freq: Every day | ORAL | Status: DC

## 2012-09-24 MED ORDER — LEVOTHYROXINE SODIUM 150 MCG PO TABS: 150.0000 ug | ORAL_TABLET | Freq: Every day | ORAL | Status: DC

## 2012-09-24 MED ORDER — GABAPENTIN 300 MG PO CAPS: 300.0000 mg | ORAL_CAPSULE | Freq: Every day | ORAL | Status: DC

## 2012-09-24 NOTE — Progress Notes (Signed)
  Subjective:    Patient ID: Joshua Mcknight, male    DOB: Jan 18, 1961, 51 y.o.   MRN: [id number]  HPI  Joshua Mcknight comes in for follow up.    Hypothyroid- he is taking his synthroid without difficulty.  He denies weight loss, fatigue, palpitations.  He does endorse difficulty sleeping but relates this to his back pain.    Back pain- was lifting something heavy while working in the yard a month and a half ago.  Has seen Ortho and had normal MRI.  He is taking ibuprofen and hydrocodone, and going to PT.  He says they dull the pain.  The pain starts in his right low back and shoots down his right leg.  He denies numbness, tingling, weakness in feet/lower legs.  He does endorse bilateral swelling in ankles/legs.  He says he twisted the right one when he hurt his back, but can't believe an ankle sprain wouldn't be better by now.  He has had swelling in his left leg for a long time due to "breaking his knee" when he was 19.  He denies any chest pain, dyspnea, or other cardiac problems.   HLD: Patient is not taking simvastatin.  Last lipid profile was about one year ago, total cholesterol was 262, LDL was 192.    Review of Systems See HPI    Objective:   Physical Exam BP 142/83  Pulse 80  Ht 5\' 10"  (1.778 m)  Wt 145 lb (65.772 kg)  BMI 20.81 kg/m2 General appearance: alert, cooperative and no distress Neck: no adenopathy, no JVD and supple, symmetrical, trachea midline Back: symmetric, no curvature. ROM normal. No CVA tenderness.  Normal strength, reflexes and sensation of bilateral LE, Pain with straight leg raise on right leg. Lungs: clear to auscultation bilaterally Heart: regular rate and rhythm, S1, S2 normal, no murmur, click, rub or gallop Extremities: trace LE edema.        Assessment & Plan:

## 2012-09-24 NOTE — Assessment & Plan Note (Signed)
On narcotics, NSAIDS, and doing therapy per Ortho.  I will add gabapentin for nerve irritation.  Suspect this will also help with sleep.

## 2012-09-24 NOTE — Assessment & Plan Note (Signed)
Recheck TSH 

## 2012-09-24 NOTE — Assessment & Plan Note (Signed)
Suspect this is dependent edema, but will check BNP to make sure CHF not contributing.

## 2012-09-24 NOTE — Patient Instructions (Signed)
It was good to see you.  Please continue the physical therapy for your back.  I also want you to try taking gabapentin for nerve irritation.  Start by taking it only at bedtime, and after two weeks you can increase to twice daily.  If you tolerate twice daily, in another week you can increase to three times daily.   I will send you a letter with your lab results, or call you if anything is abnormal.

## 2012-09-24 NOTE — Assessment & Plan Note (Signed)
Reinforced importance of taking statin, refilled medication, also will check fasting lipids today.

## 2012-09-25 ENCOUNTER — Encounter: Payer: Self-pay | Admitting: Family Medicine

## 2012-10-03 ENCOUNTER — Telehealth: Payer: Self-pay | Admitting: Family Medicine

## 2012-10-03 MED ORDER — PREGABALIN 100 MG PO CAPS: 100.0000 mg | ORAL_CAPSULE | Freq: Two times a day (BID) | ORAL | Status: DC

## 2012-10-03 NOTE — Telephone Encounter (Signed)
Please call in Lyrica.  He will need to see me again in one month to see how he is doing on this medication.

## 2012-10-03 NOTE — Telephone Encounter (Signed)
Patient is not able to take Gabapentin because they give him hallucinations, so he needs something to replace it.  He uses [facility] [address].

## 2012-10-04 NOTE — Telephone Encounter (Signed)
Rx called in verbally.  Attempted to call patient but he is unavailable right now. Monty Mccarrell, Maryjo Rochester

## 2012-12-05 ENCOUNTER — Encounter: Payer: Self-pay | Admitting: Family Medicine

## 2012-12-05 ENCOUNTER — Ambulatory Visit (INDEPENDENT_AMBULATORY_CARE_PROVIDER_SITE_OTHER): Payer: Managed Care, Other (non HMO) | Admitting: Family Medicine

## 2012-12-05 VITALS — BP 116/72 | HR 77 | Temp 98.1°F | Ht 70.0 in | Wt 143.0 lb

## 2012-12-05 DIAGNOSIS — Z791 Long term (current) use of non-steroidal anti-inflammatories (NSAID): Secondary | ICD-10-CM

## 2012-12-05 DIAGNOSIS — E7800 Pure hypercholesterolemia, unspecified: Secondary | ICD-10-CM

## 2012-12-05 DIAGNOSIS — M549 Dorsalgia, unspecified: Secondary | ICD-10-CM

## 2012-12-05 LAB — BASIC METABOLIC PANEL
CO2: 30 mEq/L (ref 19–32)
Chloride: 104 mEq/L (ref 96–112)
Potassium: 4.2 mEq/L (ref 3.5–5.3)
Sodium: 141 mEq/L (ref 135–145)

## 2012-12-05 MED ORDER — CYCLOBENZAPRINE HCL 10 MG PO TABS: 10.0000 mg | ORAL_TABLET | Freq: Three times a day (TID) | ORAL | Status: DC | PRN

## 2012-12-05 NOTE — Assessment & Plan Note (Signed)
Now taking simvastatin, re-check LDL today.

## 2012-12-05 NOTE — Patient Instructions (Signed)
I am checking your cholesterol to see if it has improved, please continue the simvastatin every day.    For your back, please try the flexeril, and also use a heating pad or ice pack, whichever feels better.   Please limit the amount of ibuprofen, aspirin, and BC powder you use because it can cause stomach bleeding and kidney trouble.   I will send you a letter with your lab results, or call you if anything is abnormal.

## 2012-12-05 NOTE — Progress Notes (Signed)
  Subjective:    Patient ID: Joshua Mcknight, male    DOB: [birth year], 52 y.o.   MRN: [id number]  HPI  Joshua Mcknight comes in for follow up.    Back pain- went to see a back doctor, flexeril helped.  Back doctor also prescribed hydrocodone, he says that he only had to take the hydrocodone a few times a week when he was taking flexeril. He has been to PT and says that makes his pain worse. He says that tramadol makes him sick to his stomach, lyrica he couldn't afford, and neurontin made him "crazy."  He was taking 3200 mg of ibuprofen a day, but now has switched and is taking BC powders.  He was doing much better but then ran out of the flexeril and now is much worse.    Cholesterol- was not taking simvastatin, had cholesterol checked and says he is taking it most days again now.    Bleeding in his ear- noticed it 2 days ago, then again yesterday.  He denies any URI symptoms, drainage from the ear.  He does use q-tips.   Past Medical History  Diagnosis Date  . Hyperlipidemia   . Thyroid disease    History  Substance Use Topics  . Smoking status: Current Every Day Smoker -- 2.0 packs/day for 32 years    Types: Cigarettes  . Smokeless tobacco: Never Used  . Alcohol Use: No   Review of Systems Pertinent items in HPI    Objective:   Physical Exam BP 116/72  Pulse 77  Temp 98.1 F (36.7 C) (Oral)  Ht 5\' 10"  (1.778 m)  Wt 143 lb (64.864 kg)  BMI 20.52 kg/m2 General appearance: alert and no distress Lungs: clear to auscultation bilaterally Heart: regular rate and rhythm, S1, S2 normal, no murmur, click, rub or gallop Extremities: extremities normal, atraumatic, no cyanosis or edema Pulses: 2+ and symmetric      Assessment & Plan:

## 2012-12-05 NOTE — Assessment & Plan Note (Signed)
Discussed back health, including stretches/exercises and smoking cessation.  I told him I am willing to rx the flexeril.  Discussed overuse of NSAIDS can cause stomach bleeding and kidney problems; will check BMET to monitor creatinine.  Patient became upset when I told him that I was not willing to refill narcotics.  He has not received narcotics from our clinic, but has had 2 refills from another physican that his pharmacy record shows.  Told him I do not feel comfortable continuing this mediation.

## 2012-12-05 NOTE — Assessment & Plan Note (Signed)
Check BMET.  Discussed dangers of continuous use.

## 2012-12-10 ENCOUNTER — Encounter: Payer: Self-pay | Admitting: Family Medicine

## 2013-04-28 ENCOUNTER — Other Ambulatory Visit: Payer: Self-pay | Admitting: Family Medicine

## 2013-05-15 ENCOUNTER — Ambulatory Visit
Admission: RE | Admit: 2013-05-15 | Discharge: 2013-05-15 | Disposition: A | Discharge status: Home / Self Care | Payer: Managed Care, Other (non HMO) | Source: Ambulatory Visit | Attending: Family Medicine | Admitting: Family Medicine

## 2013-05-15 ENCOUNTER — Telehealth: Payer: Self-pay | Admitting: Family Medicine

## 2013-05-15 ENCOUNTER — Encounter: Payer: Self-pay | Admitting: Family Medicine

## 2013-05-15 ENCOUNTER — Ambulatory Visit (INDEPENDENT_AMBULATORY_CARE_PROVIDER_SITE_OTHER): Payer: Managed Care, Other (non HMO) | Admitting: Family Medicine

## 2013-05-15 VITALS — BP 130/81 | HR 80 | Temp 98.2°F | Ht 70.0 in | Wt 140.9 lb

## 2013-05-15 DIAGNOSIS — R042 Hemoptysis: Secondary | ICD-10-CM

## 2013-05-15 DIAGNOSIS — K089 Disorder of teeth and supporting structures, unspecified: Secondary | ICD-10-CM

## 2013-05-15 LAB — CBC WITH DIFFERENTIAL/PLATELET
Basophils Relative: 1 % (ref 0–1)
Eosinophils Absolute: 0.1 10*3/uL (ref 0.0–0.7)
Eosinophils Relative: 2 % (ref 0–5)
Hemoglobin: 12.2 g/dL — ABNORMAL LOW (ref 13.0–17.0)
Lymphs Abs: 1.7 10*3/uL (ref 0.7–4.0)
MCH: 28.8 pg (ref 26.0–34.0)
MCHC: 34.1 g/dL (ref 30.0–36.0)
MCV: 84.4 fL (ref 78.0–100.0)
Monocytes Absolute: 0.5 10*3/uL (ref 0.1–1.0)
Monocytes Relative: 8 % (ref 3–12)
Neutrophils Relative %: 64 % (ref 43–77)
RBC: 4.24 MIL/uL (ref 4.22–5.81)

## 2013-05-15 LAB — COMPLETE METABOLIC PANEL WITH GFR
Alkaline Phosphatase: 68 U/L (ref 39–117)
BUN: 5 mg/dL — ABNORMAL LOW (ref 6–23)
CO2: 28 mEq/L (ref 19–32)
Creat: 0.73 mg/dL (ref 0.50–1.35)
GFR, Est African American: >89 mL/min
GFR, Est Non African American: >89 mL/min
Glucose, Bld: 85 mg/dL (ref 70–99)
Sodium: 139 mEq/L (ref 135–145)
Total Bilirubin: 0.7 mg/dL (ref 0.3–1.2)
Total Protein: 6.2 g/dL (ref 6.0–8.3)

## 2013-05-15 LAB — PROTIME-INR: Prothrombin Time: 12.4 seconds (ref 11.6–15.2)

## 2013-05-15 NOTE — Progress Notes (Signed)
Subjective:     Patient ID: Joshua Mcknight, male   DOB: 09-30-1961, 52 y.o.   MRN: [id number]  HPI 52 year old male with history of heavy smoking greater than 30 pack years and chronic NSAID use presents for same-day visit to discuss the following:  #1 hemoptysis: Patient reports that he had 5 episodes of hemoptysis that started 2 days ago. He was in his normal state of health until he arrived home from work around 5:30 PM and started coughing up blood. He continued to cough up blood throughout the evening. He reports that the blood was mixed with sputum and he would cough up about a quarter cup volume at a time. Hemoptysis resolved on Wednesday morning. He denies associated chest pain, shortness of breath, nausea, vomiting, dizziness, lightheadedness. He continues to smoke one pack per day of cough mom. 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 He also takes one BC powder daily.   Review of Systems As per history of present illness    Objective:   Physical Exam BP 130/81  Pulse 80  Temp(Src) 98.2 F (36.8 C) (Oral)  Ht 5\' 10"  (1.778 m)  Wt 140 lb 14.4 oz (63.912 kg)  BMI 20.22 kg/m2 General appearance: alert, cooperative, appears older than stated age and thin Head: Normocephalic, without obvious abnormality, atraumatic Throat: abnormal findings: dentition: poor Neck: no adenopathy, no carotid bruit, no JVD, supple, symmetrical, trachea midline and thyroid not enlarged, symmetric, no tenderness/mass/nodules Lungs: clear to auscultation bilaterally Heart: regular rate and rhythm, S1, S2 normal, no murmur, click, rub or gallop Pulses: 2+ and symmetric  Lab Results  Component Value Date   HGB 12.2* 05/15/2013       Assessment and Plan:

## 2013-05-15 NOTE — Assessment & Plan Note (Addendum)
A: hemoptysis, resolved. Concern for primary lung cancer giving smoking history. P:  Advised patient to DC BC powder. Will confirm normal hemoglobin with a CBC. Will also check CMP to evaluate LFTs Will order CXR.  Patient not ready to quit smoking.  Reviewed s/s to prompt return to care. Advised patient schedule PCP f/u.

## 2013-05-15 NOTE — Telephone Encounter (Signed)
Call patient chest x-ray normal except for hyperaeration suggestive of mild emphysema. Advised patient keep followup appointment with new PCP Dr. Efrain Sella.  Informed patient that my clinic staff will be in touch with him with results of blood work.

## 2013-05-15 NOTE — Patient Instructions (Addendum)
Joshua Mcknight,  Thank you very much for coming in today.  For your hemoptysis:  Blood work: CBC, INR (clotting study) CMP (liver function)  Chest x-ray. [facility].  F/u in 2 weeks with Dr. Mikel Cella.  Call and come in sooner if hemoptysis returns and persist. Stop BC powders.   Dr. Armen Pickup

## 2013-05-16 ENCOUNTER — Encounter: Payer: Self-pay | Admitting: Family Medicine

## 2013-09-04 ENCOUNTER — Other Ambulatory Visit: Payer: Self-pay

## 2013-09-21 ENCOUNTER — Other Ambulatory Visit: Payer: Self-pay | Admitting: Family Medicine

## 2013-10-24 ENCOUNTER — Other Ambulatory Visit: Payer: Self-pay | Admitting: Family Medicine

## 2013-11-02 ENCOUNTER — Encounter (HOSPITAL_COMMUNITY): Payer: Self-pay | Admitting: Emergency Medicine

## 2013-11-02 ENCOUNTER — Emergency Department (HOSPITAL_COMMUNITY)
Admission: EM | Admit: 2013-11-02 | Discharge: 2013-11-02 | Disposition: A | Discharge status: Home / Self Care | Payer: Managed Care, Other (non HMO) | Attending: Emergency Medicine | Admitting: Emergency Medicine

## 2013-11-02 ENCOUNTER — Emergency Department (HOSPITAL_COMMUNITY): Payer: Managed Care, Other (non HMO)

## 2013-11-02 DIAGNOSIS — E079 Disorder of thyroid, unspecified: Secondary | ICD-10-CM | POA: Insufficient documentation

## 2013-11-02 DIAGNOSIS — E785 Hyperlipidemia, unspecified: Secondary | ICD-10-CM | POA: Insufficient documentation

## 2013-11-02 DIAGNOSIS — Z720 Tobacco use: Secondary | ICD-10-CM

## 2013-11-02 DIAGNOSIS — J189 Pneumonia, unspecified organism: Secondary | ICD-10-CM | POA: Insufficient documentation

## 2013-11-02 DIAGNOSIS — R000 Tachycardia, unspecified: Secondary | ICD-10-CM | POA: Insufficient documentation

## 2013-11-02 DIAGNOSIS — R51 Headache: Secondary | ICD-10-CM | POA: Insufficient documentation

## 2013-11-02 DIAGNOSIS — F17200 Nicotine dependence, unspecified, uncomplicated: Secondary | ICD-10-CM | POA: Insufficient documentation

## 2013-11-02 DIAGNOSIS — R0989 Other specified symptoms and signs involving the circulatory and respiratory systems: Secondary | ICD-10-CM | POA: Insufficient documentation

## 2013-11-02 DIAGNOSIS — Z79899 Other long term (current) drug therapy: Secondary | ICD-10-CM | POA: Insufficient documentation

## 2013-11-02 DIAGNOSIS — R05 Cough: Secondary | ICD-10-CM | POA: Insufficient documentation

## 2013-11-02 DIAGNOSIS — K089 Disorder of teeth and supporting structures, unspecified: Secondary | ICD-10-CM | POA: Insufficient documentation

## 2013-11-02 DIAGNOSIS — R059 Cough, unspecified: Secondary | ICD-10-CM | POA: Insufficient documentation

## 2013-11-02 DIAGNOSIS — R630 Anorexia: Secondary | ICD-10-CM | POA: Insufficient documentation

## 2013-11-02 DIAGNOSIS — J029 Acute pharyngitis, unspecified: Secondary | ICD-10-CM | POA: Insufficient documentation

## 2013-11-02 LAB — COMPREHENSIVE METABOLIC PANEL
ALT: 16 U/L (ref 0–53)
AST: 18 U/L (ref 0–37)
Albumin: 3.2 g/dL — ABNORMAL LOW (ref 3.5–5.2)
Alkaline Phosphatase: 100 U/L (ref 39–117)
BILIRUBIN TOTAL: 0.4 mg/dL (ref 0.3–1.2)
BUN: 10 mg/dL (ref 6–23)
CHLORIDE: 97 meq/L (ref 96–112)
CO2: 27 meq/L (ref 19–32)
Calcium: 8.8 mg/dL (ref 8.4–10.5)
Creatinine, Ser: 0.96 mg/dL (ref 0.50–1.35)
GFR calc Af Amer: >90 mL/min (ref >90)
Glucose, Bld: 103 mg/dL — ABNORMAL HIGH (ref 70–99)
Potassium: 4.6 mEq/L (ref 3.7–5.3)
Sodium: 137 mEq/L (ref 137–147)
Total Protein: 6.7 g/dL (ref 6.0–8.3)

## 2013-11-02 LAB — CBC
HEMATOCRIT: 37.8 % — AB (ref 39.0–52.0)
Hemoglobin: 12.6 g/dL — ABNORMAL LOW (ref 13.0–17.0)
MCH: 28.8 pg (ref 26.0–34.0)
MCHC: 33.3 g/dL (ref 30.0–36.0)
MCV: 86.3 fL (ref 78.0–100.0)
PLATELETS: 322 10*3/uL (ref 150–400)
RBC: 4.38 MIL/uL (ref 4.22–5.81)
RDW: 12.7 % (ref 11.5–15.5)
WBC: 11.3 10*3/uL — AB (ref 4.0–10.5)

## 2013-11-02 MED ORDER — MOXIFLOXACIN HCL 400 MG PO TABS: 400.0000 mg | ORAL_TABLET | Freq: Every day | ORAL | Status: DC

## 2013-11-02 MED ORDER — KETOROLAC TROMETHAMINE 30 MG/ML IJ SOLN
30.0000 mg | Freq: Once | INTRAMUSCULAR | Status: AC
  Administered 2013-11-02: 30 mg via INTRAVENOUS
  Filled 2013-11-02: qty 1

## 2013-11-02 MED ORDER — SODIUM CHLORIDE 0.9 % IV BOLUS (SEPSIS)
1000.0000 mL | Freq: Once | INTRAVENOUS | Status: AC
  Administered 2013-11-02: 1000 mL via INTRAVENOUS

## 2013-11-02 NOTE — ED Provider Notes (Signed)
CSN: [id number]     Arrival date & time 11/02/13  1041 History   First MD Initiated Contact with Patient 11/02/13 1111     Chief Complaint  Patient presents with  . Sore Throat  . Cough  . Headache   (Consider location/radiation/quality/duration/timing/severity/associated sxs/prior Treatment) HPI Comments: Joshua Mcknight is a 53 y.o. year-old male with a past medical history of tobacco abuse (64-pack year), thyroid disease, presenting the Emergency Department with a chief complaint of persistant fever for 11 days.  The patient reports 102 fever today.  He states that he was evaluated by a PCP 10 days ago and was diagnosed with Strep throat and was started on Penicillin, last dose tomorrow.  He reports associated headache in the center of his forehead for 1 day.  He reports decrease in oral intake due to decrease in appetite.   He reports a productive cough.    The history is provided by the patient and the spouse. No language interpreter was used.    Past Medical History  Diagnosis Date  . Hyperlipidemia   . Thyroid disease    History reviewed. No pertinent past surgical history. History reviewed. No pertinent family history. History  Substance Use Topics  . Smoking status: Current Every Day Smoker -- 2.00 packs/day for 32 years    Types: Cigarettes  . Smokeless tobacco: Never Used  . Alcohol Use: No    Review of Systems  Constitutional: Positive for fever and chills.  HENT: Positive for sore throat.   Respiratory: Positive for cough. Negative for shortness of breath.   Cardiovascular: Negative for chest pain and leg swelling.  Gastrointestinal: Negative for nausea, vomiting, abdominal pain, diarrhea, constipation, blood in stool, abdominal distention and anal bleeding.  Musculoskeletal: Negative for neck pain and neck stiffness.  Neurological: Positive for headaches. Negative for dizziness and light-headedness.    Allergies  Review of patient's allergies indicates no known  allergies.  Home Medications   Current Outpatient Rx  Name  Route  Sig  Dispense  Refill  . cyclobenzaprine (FLEXERIL) 10 MG tablet   Oral   Take 1 tablet (10 mg total) by mouth 3 (three) times daily as needed for muscle spasms.   90 tablet   3   . HYDROcodone-acetaminophen (NORCO/VICODIN) 5-325 MG per tablet   Oral   Take 1 tablet by mouth every 6 (six) hours as needed for moderate pain (Back pain). Rx from back doctor, not [facility].         Marland Kitchen ibuprofen (ADVIL,MOTRIN) 200 MG tablet   Oral   Take 800 mg by mouth every 6 (six) hours as needed for headache.         . penicillin v potassium (VEETID) 500 MG tablet   Oral   Take 500 mg by mouth 3 (three) times daily.          . simvastatin (ZOCOR) 40 MG tablet   Oral   Take 40 mg by mouth daily.         Marland Kitchen SYNTHROID 150 MCG tablet      TAKE 1 TABLET BY MOUTH EVERY DAY   30 tablet   0     Dispense as written.    Must have office visit for additional refills.    BP 110/93  Pulse 113  Temp(Src) 98.5 F (36.9 C) (Oral)  Resp 20  SpO2 97% Physical Exam  Nursing note and vitals reviewed. Constitutional: He is oriented to person, place, and time. He appears well-developed and well-nourished.  No distress.  HENT:  Head: Normocephalic and atraumatic.  Right Ear: External ear normal.  Left Ear: External ear normal.  Mouth/Throat: Uvula is midline. No oropharyngeal exudate.  Poor oral hygiene.  Multiple black teeth and multiple missing teeth.  Eyes: EOM are normal. Pupils are equal, round, and reactive to light. No scleral icterus.  Neck: Normal range of motion. Neck supple.  Cardiovascular: Regular rhythm.  Tachycardia present.   No lower extremity edema  Pulmonary/Chest: Effort normal and breath sounds normal. No accessory muscle usage. Not tachypneic. No respiratory distress. He has no wheezes. He has no rhonchi.  Prolonged expiratory phase. Hyperresonance on upper lung fields.   Abdominal: Soft. Normal appearance and  bowel sounds are normal.  Lymphadenopathy:    He has no cervical adenopathy.  Neurological: He is alert and oriented to person, place, and time. No cranial nerve deficit.  Skin: Skin is warm and dry. No rash noted. No erythema.  Psychiatric: He has a normal mood and affect. His behavior is normal.    ED Course  Procedures (including critical care time) Labs Review Labs Reviewed  CBC - Abnormal; Notable for the following:    WBC 11.3 (*)    Hemoglobin 12.6 (*)    HCT 37.8 (*)    All other components within normal limits  COMPREHENSIVE METABOLIC PANEL - Abnormal; Notable for the following:    Glucose, Bld 103 (*)    Albumin 3.2 (*)    All other components within normal limits   Imaging Review Dg Chest 2 View  11/02/2013   CLINICAL DATA:  Sore throat with cough and headache as well as fever.  EXAM: CHEST  2 VIEW  COMPARISON:  04/29/2013  FINDINGS: Lungs are well inflated with chronic scarring and volume loss of the right upper lobe/apex slightly worse. There is mild scarring over the left apex. No evidence of effusion. Cardiomediastinal silhouette and remainder of the exam is unchanged.  IMPRESSION: Volume loss right upper lobe/ apex with stable scarring over the medial left apex and slight worsening coarse interstitial changes with subtle peripheral airspace density right upper lobe/apex. This may represent progression of chronic scarring although cannot exclude post primary TB or other infection.   Electronically Signed   By: Elberta Fortisaniel  Boyle M.D.   On: 11/02/2013 12:35    EKG Interpretation   None       MDM   1. Pneumonia   2. Tobacco abuse    Pt with a history of fever and cough.  Likely pneumonia, XR ordered.  XR with possible infection process, will treat for pneumonia given history and symptoms. Discussed patient history, condition, and labs with Dr. Effie ShyWentz who agrees the patient can be evaluated as an out-pt. Discussed lab results, imaging results, and treatment plan with the  patient. Return precautions given. Reports understanding and no other concerns at this time.  Patient is stable for discharge at this time.  Meds given in ED:  Medications  ketorolac (TORADOL) 30 MG/ML injection 30 mg (30 mg Intravenous Given 11/02/13 1235)  sodium chloride 0.9 % bolus 1,000 mL (0 mLs Intravenous Stopped 11/02/13 1414)    Discharge Medication List as of 11/02/2013  2:00 PM    START taking these medications   Details  moxifloxacin (AVELOX) 400 MG tablet Take 1 tablet (400 mg total) by mouth daily., Starting 11/02/2013, Until Discontinued, Print          Clabe SealLauren M Edwin Baines, PA-C 11/05/13 [phone]

## 2013-11-02 NOTE — Discharge Instructions (Signed)
Call for a follow up appointment with a Family or Primary Care Provider.  °Return if Symptoms worsen.   °Take medication as prescribed.  ° °

## 2013-11-02 NOTE — ED Notes (Addendum)
Pt states he has been having intermittent sore throat, cough and fever since Christmas. Pt was treated for strep throat on 12/24. Pt states he also has a headache.

## 2013-11-05 NOTE — ED Provider Notes (Signed)
Medical screening examination/treatment/procedure(s) were performed by non-physician practitioner and as supervising physician I was immediately available for consultation/collaboration.  EKG Interpretation   None        Flint MelterElliott L Korrina Zern, MD 11/05/13 [phone]

## 2013-11-14 ENCOUNTER — Telehealth: Payer: Self-pay | Admitting: Family Medicine

## 2013-11-14 MED ORDER — SYNTHROID 150 MCG PO TABS: ORAL_TABLET | ORAL | Status: DC

## 2013-11-14 NOTE — Telephone Encounter (Signed)
Blue team-I refilled synthroid. Prior refill said must have office visit. I put this on current rx as well. Please call patient to schedule. If we cannot reach patient, please send patient a letter. Thanks!

## 2013-11-14 NOTE — Telephone Encounter (Signed)
LM for pt to call back.  Joshua Mcknight,CMA  

## 2013-11-17 NOTE — Telephone Encounter (Signed)
Unable to reach pt.  Letter mailed. Jazmin Hartsell,CMA

## 2013-12-11 ENCOUNTER — Other Ambulatory Visit (HOSPITAL_COMMUNITY): Payer: Self-pay | Admitting: Nurse Practitioner

## 2013-12-11 ENCOUNTER — Ambulatory Visit (HOSPITAL_COMMUNITY)
Admission: RE | Admit: 2013-12-11 | Discharge: 2013-12-11 | Disposition: A | Discharge status: Home / Self Care | Payer: Managed Care, Other (non HMO) | Source: Ambulatory Visit | Attending: Nurse Practitioner | Admitting: Nurse Practitioner

## 2013-12-11 DIAGNOSIS — J9801 Acute bronchospasm: Secondary | ICD-10-CM

## 2013-12-11 DIAGNOSIS — J189 Pneumonia, unspecified organism: Secondary | ICD-10-CM | POA: Insufficient documentation

## 2013-12-11 DIAGNOSIS — J984 Other disorders of lung: Secondary | ICD-10-CM | POA: Insufficient documentation

## 2013-12-30 LAB — PULMONARY FUNCTION TEST

## 2014-01-28 ENCOUNTER — Encounter: Payer: Self-pay | Admitting: Internal Medicine

## 2014-01-28 ENCOUNTER — Ambulatory Visit (INDEPENDENT_AMBULATORY_CARE_PROVIDER_SITE_OTHER): Payer: Managed Care, Other (non HMO) | Admitting: Internal Medicine

## 2014-01-28 VITALS — BP 130/82 | HR 81 | Ht 70.0 in | Wt 138.1 lb

## 2014-01-28 DIAGNOSIS — E7800 Pure hypercholesterolemia, unspecified: Secondary | ICD-10-CM

## 2014-01-28 DIAGNOSIS — R0789 Other chest pain: Secondary | ICD-10-CM

## 2014-01-28 DIAGNOSIS — I2510 Atherosclerotic heart disease of native coronary artery without angina pectoris: Secondary | ICD-10-CM

## 2014-01-28 DIAGNOSIS — R931 Abnormal findings on diagnostic imaging of heart and coronary circulation: Secondary | ICD-10-CM

## 2014-01-28 DIAGNOSIS — E785 Hyperlipidemia, unspecified: Secondary | ICD-10-CM

## 2014-01-28 DIAGNOSIS — F17200 Nicotine dependence, unspecified, uncomplicated: Secondary | ICD-10-CM

## 2014-01-28 NOTE — Patient Instructions (Addendum)
  Your physician recommends that you return for lab work in a few days to a week - fasting (nothing to eat/drink after midnight)  Start on aspirin 81mg  once daily.   Your physician wants you to follow-up in: 6 months with Dr. Rennis GoldenHilty. You will receive a reminder letter in the mail two months in advance. If you don't receive a letter, please call our office to schedule the follow-up appointment.

## 2014-02-02 LAB — NMR LIPOPROFILE WITH LIPIDS
CHOLESTEROL, TOTAL: 254 mg/dL — AB (ref <200)
HDL PARTICLE NUMBER: 29.3 umol/L — AB (ref >=30.5)
HDL Size: 8.6 nm — ABNORMAL LOW (ref >=9.2)
HDL-C: 47 mg/dL (ref >=40)
LDL CALC: 181 mg/dL — AB (ref <100)
LDL PARTICLE NUMBER: 2311 nmol/L — AB (ref <1000)
LDL Size: 20.9 nm (ref >20.5)
LP-IR SCORE: 44 (ref <=45)
Large HDL-P: 3 umol/L — ABNORMAL LOW (ref >=4.8)
Large VLDL-P: <0.8 nmol/L (ref <=2.7)
SMALL LDL PARTICLE NUMBER: 807 nmol/L — AB (ref <=527)
TRIGLYCERIDES: 130 mg/dL (ref <150)
VLDL SIZE: 37.7 nm (ref <=46.6)

## 2014-02-04 ENCOUNTER — Encounter: Payer: Self-pay | Admitting: Internal Medicine

## 2014-02-04 DIAGNOSIS — R0789 Other chest pain: Secondary | ICD-10-CM | POA: Insufficient documentation

## 2014-02-04 DIAGNOSIS — R931 Abnormal findings on diagnostic imaging of heart and coronary circulation: Secondary | ICD-10-CM | POA: Insufficient documentation

## 2014-02-04 NOTE — Progress Notes (Signed)
OFFICE NOTE  Chief Complaint:  Chest pressure, dyspnea, abnormal CT showing coronary calcium  Primary Care Physician: Marin Comment, FNP  HPI:  Joshua Mcknight is a pleasant 53 year old male who is coming referred to me for evaluation of coronary disease. He receives been having problems with shortness of breath and chest pressure. A CT scan of the chest demonstrated centrilobular emphysema, without pneumonia. There was age advanced coronary atherosclerosis, especially calcium noted in the left circumflex. He does have a long-standing smoking history as well as a family history of heart disease. He has been reporting chest tightness at rest, mostly in the morning as well as shortness of breath and worsening chest tightness during the day. He underwent a pulmonary function study which was mildly abnormal. In addition he apparently has had a cardiometabolic stress test which did not demonstrate ischemic EKG changes and his VO2 was 89% predicted, which is pretty good. An EKG shows normal sinus rhythm without ischemic changes.  PMHx:  Past Medical History  Diagnosis Date  . Hyperlipidemia   . Thyroid disease     Past Surgical History  Procedure Laterality Date  . Knee surgery Left age 89    FAMHx:  Family History  Problem Relation Age of Onset  . CAD Father     CABG at 28  . Aneurysm Father     SOCHx:   reports that he has been smoking Cigarettes.  He has a 64 pack-year smoking history. He has never used smokeless tobacco. He reports that he does not drink alcohol or use illicit drugs.  ALLERGIES:  Allergies  Allergen Reactions  . Coconut Oil Rash    ROS: A comprehensive review of systems was negative except for: Respiratory: positive for dyspnea on exertion Cardiovascular: positive for exertional chest pressure/discomfort  HOME MEDS: Current Outpatient Prescriptions  Medication Sig Dispense Refill  . cyclobenzaprine (FLEXERIL) 10 MG tablet Take 1 tablet (10 mg total) by  mouth 3 (three) times daily as needed for muscle spasms.  90 tablet  3  . ibuprofen (ADVIL,MOTRIN) 200 MG tablet Take 800 mg by mouth every 6 (six) hours as needed for headache.      Marland Kitchen SYNTHROID 150 MCG tablet TAKE 1 TABLET BY MOUTH EVERY DAY  30 tablet  0   No current facility-administered medications for this visit.    LABS/IMAGING: No results found for this or any previous visit (from the past 48 hour(s)). No results found.  VITALS: BP 130/82  Pulse 81  Ht 5\' 10"  (1.778 m)  Wt 138 lb 1.6 oz (62.642 kg)  BMI 19.82 kg/m2  EXAM: General appearance: alert and no distress Neck: no carotid bruit and no JVD Lungs: clear to auscultation bilaterally Heart: regular rate and rhythm, S1, S2 normal, no murmur, click, rub or gallop Abdomen: soft, non-tender; bowel sounds normal; no masses,  no organomegaly Extremities: extremities normal, atraumatic, no cyanosis or edema Pulses: 2+ and symmetric Skin: Skin color, texture, turgor normal. No rashes or lesions Neurologic: Grossly normal Psych: Mood, affect normal  EKG: Normal sinus rhythm at 81  ASSESSMENT: 1. Chest pressure/shortness of breath 2. Abnormal CT indicating coronary calcium 3. Family history of coronary disease 4. Long-standing tobacco abuse 5. Early emphysematous changes of the lung 6. Essentially normal cardiometabolic testing with a VO2 of 89%  PLAN: 1.  Joshua Mcknight has essentially had a workup for his coronary calcium. A stress test performed in your office is low risk with a relatively preserved VO2 of 89%. This would  argue that his chest pain is not likely cardiac. If he were to have had obstructive coronary artery disease, one would expect is the VO2 to be less than 50% typically.  From what I can tell there were no ischemic EKG changes with exercise on the bicycle. I am not certain that additional stress testing would be of any benefit. He essentially needs aggressive medical therapy at this point. I was really  recommend starting low-dose aspirin 81 mg daily.  I will also check a lipid profile and most likely recommend starting statin therapy based on his cholesterol with a goal LDL less than 70. I've also counseled him about smoking cessation.  Thank you as always for the kind referral. I will plan to see him back in 6 months.  Chrystie NoseKenneth C. Hilty, MD, [facility] CenterFACC Attending Cardiologist [facility]  Chrystie NoseKenneth C. Hilty 02/04/2014, 7:17 PM

## 2014-02-05 NOTE — Progress Notes (Signed)
LMTCB

## 2014-02-09 ENCOUNTER — Other Ambulatory Visit: Payer: Self-pay | Admitting: *Deleted

## 2014-02-09 ENCOUNTER — Telehealth: Payer: Self-pay | Admitting: *Deleted

## 2014-02-09 DIAGNOSIS — E785 Hyperlipidemia, unspecified: Secondary | ICD-10-CM

## 2014-02-09 MED ORDER — ATORVASTATIN CALCIUM 80 MG PO TABS: 80.0000 mg | ORAL_TABLET | Freq: Every day | ORAL | Status: DC

## 2014-02-09 NOTE — Telephone Encounter (Signed)
LM regarding lab results - stating would release to MyChart and he should call back if he cannot access results. Med & lab ordered. Lab slip mailed to patient.

## 2014-02-09 NOTE — Telephone Encounter (Signed)
Rx was sent to pharmacy electronically. 

## 2014-02-09 NOTE — Telephone Encounter (Signed)
Message copied by Lindell SparELKINS, Mays Paino M on Mon Feb 09, 2014  3:10 PM ------      Message from: Chrystie NoseHILTY, KENNETH C      Created: Thu Feb 05, 2014  9:54 AM       Cholesterol is too high for known coronary disease.  Would recommend starting atorvastatin 80 mg daily.  Recheck lipid NMR in 6 months.            Dr. Rennis GoldenHilty ------

## 2014-08-13 ENCOUNTER — Ambulatory Visit (HOSPITAL_COMMUNITY)
Admission: RE | Admit: 2014-08-13 | Discharge: 2014-08-13 | Disposition: A | Discharge status: Home / Self Care | Payer: Managed Care, Other (non HMO) | Source: Ambulatory Visit | Attending: Nurse Practitioner | Admitting: Nurse Practitioner

## 2014-08-13 ENCOUNTER — Other Ambulatory Visit (HOSPITAL_COMMUNITY): Payer: Self-pay | Admitting: Nurse Practitioner

## 2014-08-13 DIAGNOSIS — R0602 Shortness of breath: Secondary | ICD-10-CM | POA: Insufficient documentation

## 2014-08-13 DIAGNOSIS — Z87891 Personal history of nicotine dependence: Secondary | ICD-10-CM | POA: Diagnosis not present

## 2014-08-13 DIAGNOSIS — R05 Cough: Secondary | ICD-10-CM | POA: Insufficient documentation

## 2015-09-02 ENCOUNTER — Emergency Department (HOSPITAL_COMMUNITY): Payer: Managed Care, Other (non HMO)

## 2015-09-02 ENCOUNTER — Encounter (HOSPITAL_COMMUNITY): Payer: Self-pay | Admitting: Radiology

## 2015-09-02 ENCOUNTER — Emergency Department (HOSPITAL_COMMUNITY)
Admission: EM | Admit: 2015-09-02 | Discharge: 2015-09-02 | Disposition: A | Discharge status: Home / Self Care | Payer: Managed Care, Other (non HMO) | Attending: Emergency Medicine | Admitting: Emergency Medicine

## 2015-09-02 DIAGNOSIS — Z7952 Long term (current) use of systemic steroids: Secondary | ICD-10-CM | POA: Insufficient documentation

## 2015-09-02 DIAGNOSIS — R42 Dizziness and giddiness: Secondary | ICD-10-CM

## 2015-09-02 DIAGNOSIS — J441 Chronic obstructive pulmonary disease with (acute) exacerbation: Secondary | ICD-10-CM | POA: Insufficient documentation

## 2015-09-02 DIAGNOSIS — E785 Hyperlipidemia, unspecified: Secondary | ICD-10-CM | POA: Insufficient documentation

## 2015-09-02 DIAGNOSIS — Z79899 Other long term (current) drug therapy: Secondary | ICD-10-CM | POA: Diagnosis not present

## 2015-09-02 DIAGNOSIS — Z720 Tobacco use: Secondary | ICD-10-CM | POA: Diagnosis not present

## 2015-09-02 DIAGNOSIS — E079 Disorder of thyroid, unspecified: Secondary | ICD-10-CM | POA: Diagnosis not present

## 2015-09-02 LAB — URINALYSIS, ROUTINE W REFLEX MICROSCOPIC
BILIRUBIN URINE: NEGATIVE
Glucose, UA: NEGATIVE mg/dL
Hgb urine dipstick: NEGATIVE
KETONES UR: NEGATIVE mg/dL
LEUKOCYTES UA: NEGATIVE
NITRITE: NEGATIVE
PROTEIN: NEGATIVE mg/dL
Specific Gravity, Urine: 1.002 — ABNORMAL LOW (ref 1.005–1.030)
UROBILINOGEN UA: 0.2 mg/dL (ref 0.0–1.0)
pH: 7 (ref 5.0–8.0)

## 2015-09-02 LAB — BASIC METABOLIC PANEL
ANION GAP: 8 (ref 5–15)
BUN: 7 mg/dL (ref 6–20)
CHLORIDE: 103 mmol/L (ref 101–111)
CO2: 28 mmol/L (ref 22–32)
Calcium: 9 mg/dL (ref 8.9–10.3)
Creatinine, Ser: 0.76 mg/dL (ref 0.61–1.24)
GFR calc non Af Amer: >60 mL/min (ref >60)
Glucose, Bld: 100 mg/dL — ABNORMAL HIGH (ref 65–99)
Potassium: 4.1 mmol/L (ref 3.5–5.1)
Sodium: 139 mmol/L (ref 135–145)

## 2015-09-02 LAB — CBC WITH DIFFERENTIAL/PLATELET
BASOS ABS: 0 10*3/uL (ref 0.0–0.1)
BASOS PCT: 0 %
EOS ABS: 0 10*3/uL (ref 0.0–0.7)
Eosinophils Relative: 0 %
HCT: 40.8 % (ref 39.0–52.0)
HEMOGLOBIN: 13.9 g/dL (ref 13.0–17.0)
Lymphocytes Relative: 6 %
Lymphs Abs: 0.9 10*3/uL (ref 0.7–4.0)
MCH: 29.3 pg (ref 26.0–34.0)
MCHC: 34.1 g/dL (ref 30.0–36.0)
MCV: 86.1 fL (ref 78.0–100.0)
Monocytes Absolute: 0.2 10*3/uL (ref 0.1–1.0)
Monocytes Relative: 2 %
NEUTROS ABS: 14.2 10*3/uL — AB (ref 1.7–7.7)
NEUTROS PCT: 92 %
Platelets: 358 10*3/uL (ref 150–400)
RBC: 4.74 MIL/uL (ref 4.22–5.81)
RDW: 12.7 % (ref 11.5–15.5)
WBC: 15.4 10*3/uL — AB (ref 4.0–10.5)

## 2015-09-02 LAB — TROPONIN I: Troponin I: <0.03 ng/mL (ref <0.031)

## 2015-09-02 LAB — CBG MONITORING, ED: GLUCOSE-CAPILLARY: 100 mg/dL — AB (ref 65–99)

## 2015-09-02 MED ORDER — MECLIZINE HCL 25 MG PO TABS
25.0000 mg | ORAL_TABLET | Freq: Once | ORAL | Status: AC
  Administered 2015-09-02: 25 mg via ORAL
  Filled 2015-09-02: qty 1

## 2015-09-02 MED ORDER — IOHEXOL 300 MG/ML  SOLN
75.0000 mL | Freq: Once | INTRAMUSCULAR | Status: AC | PRN
  Administered 2015-09-02: 80 mL via INTRAVENOUS

## 2015-09-02 MED ORDER — LEVOFLOXACIN 500 MG PO TABS: 500.0000 mg | ORAL_TABLET | Freq: Every day | ORAL | Status: DC

## 2015-09-02 MED ORDER — ONDANSETRON HCL 4 MG/2ML IJ SOLN
4.0000 mg | Freq: Once | INTRAMUSCULAR | Status: AC
  Administered 2015-09-02: 4 mg via INTRAVENOUS
  Filled 2015-09-02: qty 2

## 2015-09-02 NOTE — ED Provider Notes (Signed)
CSN: [id number]     Arrival date & time 09/02/15  4098 History   First MD Initiated Contact with Patient 09/02/15 [phone]     Chief Complaint  Patient presents with  . Dizziness     (Consider location/radiation/quality/duration/timing/severity/associated sxs/prior Treatment) HPI   PCP: Marin Comment, FNP PMH: hyperlipidemia and thyroid disease  Joshua Mcknight is a 54 y.o.  male  CHIEF COMPLAINT: dizziness  The patient arrived to work at 6 am and he went out to get a 5 gallon drum of oil and when he came back inside he felt as though his head is fuzzy and lost- he was oriented to person and place. "out of body" and it is improved but it continues to even now.   He reports pain in his right ankle, that started this morning.- had knee pain when he woke up and took a BC powder for the pain.  Headache- on the ride over in the ambulance he developed a headache frontal headache that he describes as a pressure 1/10.   Chest pain - He had a sensation he described as an itch or tingling sensation to the left side of his chest. He has been coughing up phlegm and just finished steroids and a Z-pack for emphysema exacerbation.   ROS: The patient denies diaphoresis, fever, weakness (general or focal),  change of vision,  dysphagia, aphagia, shortness of breath,  abdominal pains, nausea, vomiting, diarrhea, lower extremity swelling, rash, neck pain.   Past Medical History  Diagnosis Date  . Hyperlipidemia   . Thyroid disease    Past Surgical History  Procedure Laterality Date  . Knee surgery Left age 86   Family History  Problem Relation Age of Onset  . CAD Father     CABG at 64  . Aneurysm Father    Social History  Substance Use Topics  . Smoking status: Current Every Day Smoker -- 2.00 packs/day for 32 years    Types: Cigarettes  . Smokeless tobacco: Never Used     Comment: 1 ppd (01/28/2014)  . Alcohol Use: No    Review of Systems  10 Systems reviewed and are negative for acute  change except as noted in the HPI.    Allergies  Bee venom and Coconut oil  Home Medications   Prior to Admission medications   Medication Sig Start Date End Date Taking? Authorizing Provider  diazepam (VALIUM) 5 MG tablet Take 2.5-5 mg by mouth at bedtime as needed (for sleep).   Yes Historical Provider, MD  flintstones complete (FLINTSTONES) 60 MG chewable tablet Chew 1 tablet by mouth daily.   Yes Historical Provider, MD  levofloxacin (LEVAQUIN) 750 MG tablet Take 750 mg by mouth daily. Started 10/21 for 10 days 08/20/15  Yes Historical Provider, MD  levothyroxine (SYNTHROID, LEVOTHROID) 125 MCG tablet Take 125 mcg by mouth daily before breakfast.   Yes Historical Provider, MD  predniSONE (STERAPRED UNI-PAK 48 TAB) 10 MG (48) TBPK tablet Take by mouth as directed. Takes as directed for 12 days  Started on 10/21 08/20/15  Yes Historical Provider, MD  atorvastatin (LIPITOR) 80 MG tablet Take 1 tablet (80 mg total) by mouth daily. Patient not taking: Reported on 09/02/2015 02/09/14   Chrystie Nose, MD  levofloxacin (LEVAQUIN) 500 MG tablet Take 1 tablet (500 mg total) by mouth daily. 09/02/15   Marlon Pel, PA-C  SYNTHROID 150 MCG tablet TAKE 1 TABLET BY MOUTH EVERY DAY Patient not taking: Reported on 09/02/2015 11/14/13   Aldine Contes  Hunter, MD   BP 104/75 mmHg  Pulse 79  Temp(Src) 98.1 F (36.7 C) (Oral)  Resp 16  SpO2 100% Physical Exam  Constitutional: He is oriented to person, place, and time. He appears well-developed and well-nourished. No distress.  HENT:  Head: Normocephalic and atraumatic.  Eyes: Pupils are equal, round, and reactive to light.  Neck: Normal range of motion. Neck supple.  Cardiovascular: Normal rate and regular rhythm.   Pulmonary/Chest: Effort normal.  Abdominal: Soft.  Musculoskeletal:  No swelling to bilateral extremities. Chronic skin changes  Neurological: He is alert and oriented to person, place, and time.  Cranial nerves II-VIII and X-XII  evaluated and show no deficits. Pt alert and oriented x 3 Upper and lower extremity strength is symmetrical and physiologic Normal muscular tone No facial droop Coordination intact, no limb ataxia, finger-nose-finger normal Rapid alternating movements normal  Skin: Skin is warm and dry.  Nursing note and vitals reviewed.   ED Course  Procedures (including critical care time) Labs Review Labs Reviewed  URINALYSIS, ROUTINE W REFLEX MICROSCOPIC (NOT AT [facility]) - Abnormal; Notable for the following:    APPearance CLOUDY (*)    Specific Gravity, Urine 1.002 (*)    All other components within normal limits  CBC WITH DIFFERENTIAL/PLATELET - Abnormal; Notable for the following:    WBC 15.4 (*)    Neutro Abs 14.2 (*)    All other components within normal limits  BASIC METABOLIC PANEL - Abnormal; Notable for the following:    Glucose, Bld 100 (*)    All other components within normal limits  CBG MONITORING, ED - Abnormal; Notable for the following:    Glucose-Capillary 100 (*)    All other components within normal limits  TROPONIN I    Imaging Review Dg Chest 2 View  09/02/2015  CLINICAL DATA:  Chest pain and dizziness. Intermittent cough and congestion EXAM: CHEST  2 VIEW COMPARISON:  August 13, 2014 FINDINGS: There is stable scarring in the apices. Lungs remain slightly hyperexpanded. There is no edema or consolidation. The heart size and pulmonary vascularity are normal. No adenopathy. There is degenerative change in the thoracic spine. There is upper thoracic dextroscoliosis. IMPRESSION: No edema or consolidation. Stable apical scarring. Lungs mildly hyperexpanded, stable. Electronically Signed   By: Bretta Bang III M.D.   On: 09/02/2015 10:32   Ct Head Wo Contrast  09/02/2015  CLINICAL DATA:  Dizziness and frontal headache. Transient altered mental status earlier today, resolved EXAM: CT HEAD WITHOUT CONTRAST TECHNIQUE: Contiguous axial images were obtained from the base of the  skull through the vertex without intravenous contrast. COMPARISON:  None. FINDINGS: The ventricles are normal in size and configuration. There is no intracranial mass, hemorrhage, extra-axial fluid collection, or midline shift. The gray-white compartments are normal. No acute infarct evident. The bony calvarium appears intact. The mastoid air cells are clear. IMPRESSION: Study within normal limits. Electronically Signed   By: Bretta Bang III M.D.   On: 09/02/2015 10:28   Ct Chest W Contrast  09/02/2015  CLINICAL DATA:  Pt was at work this am (about 2 hours ago) and suddenly felt "lost" and dizzy. C/O new RT ankle pain but denies any trauma as well as a "weird" feeling in chest that was "there one minute and gone the next." Rt side chest pain^44mL OMNIPAQUE IOHEXOL 300 MG/ML SOLN EXAM: CT CHEST WITH CONTRAST TECHNIQUE: Multidetector CT imaging of the chest was performed during intravenous contrast administration. CONTRAST:  80mL OMNIPAQUE IOHEXOL 300 MG/ML  SOLN COMPARISON:  Chest radiograph of same date.  CT of 12/26/2013. FINDINGS: Mediastinum/Nodes: Normal heart size, without pericardial effusion. LAD and right coronary artery atherosclerosis. No mediastinal or hilar adenopathy. Lungs/Pleura: Right-sided pleural thickening versus trace fluid which is new since the prior CT (image 32, series 2). Centrilobular and paraseptal emphysema which is moderate. Biapical pleural parenchymal scarring. An area of soft tissue density along the anterior aspect of a posterior right apical bleb measures 12 by 10 mm (image 17, series 5). This is new since 12/26/2013. Other areas of more linear increased density along the posterior right lung are also identified (image 26, series 5). Upper abdomen: Normal imaged portions of the liver, spleen, stomach, pancreas, gallbladder, adrenal glands, kidneys. Abdominal aortic and branch vessel atherosclerosis. Musculoskeletal: Accentuation of expected thoracic kyphosis. Thoracic  spondylosis. IMPRESSION: 1.  No acute process in the chest. 2. Centrilobular and paraseptal emphysema with biapical pleural parenchymal scarring. An area of posterior right pleural thickening versus trace fluid and adjacent increased subpleural pulmonary density is favored to represent progressive scarring. A superimposed acute Mild infectious process cannot be excluded. given the somewhat nodular appearance within the posterior right apex, followup chest CT at 3 months is recommended to confirm stability or resolution. 3. Age advanced coronary artery atherosclerosis. Recommend assessment of coronary risk factors and consideration of medical therapy. Electronically Signed   By: Jeronimo GreavesKyle  Talbot M.D.   On: 09/02/2015 14:26   I have personally reviewed and evaluated these images and lab results as part of my medical decision-making.   EKG Interpretation None      MDM   Final diagnoses:  COPD exacerbation (HCC)  Dizziness    Dr. Donnald GarrePfeiffer has seen patient as well. Symptoms are not consistent with stroke or posterior stroke like symptoms. He is not orthostatic. CBC shows elevated WBC which is most likely due to prednisone, BMP is normal, neg Trop, normal urinalysis and POC CBG.  CT scan of the head does not show any acute abnormalities. Chest xray unremarkable aside from chronic changes.  CT of the chest shows scarring vs superimposing infection. The patient is not confident if he has been taking his abx or is still taking them or only taking his prednisone. Will rx Levaquin for 5 days. Also concern for thickening scaring vs early metastatic disease, recommends repeat CT scan at 3 months. Discussed this with wife who agrees to let his PCP aware. Dr. Donnald GarrePfeiffer agreeable to work and discharge plan. Patient and wife agreeable. Pt ready for dc.  Medications  meclizine (ANTIVERT) tablet 25 mg (25 mg Oral Given 09/02/15 1102)  ondansetron (ZOFRAN) injection 4 mg (4 mg Intravenous Given 09/02/15 1102)  iohexol  (OMNIPAQUE) 300 MG/ML solution 75 mL (80 mLs Intravenous Contrast Given 09/02/15 1341)    54 y.o.Fortunato Curlingonald Cozza's medical screening exam was performed and I feel the patient has had an appropriate workup for their chief complaint at this time and likelihood of emergent condition existing is low. They have been counseled on decision, discharge, follow up and which symptoms necessitate immediate return to the emergency department. They or their family verbally stated understanding and agreement with plan and discharged in stable condition.   Vital signs are stable at discharge. Filed Vitals:   09/02/15 1337  BP: 104/75  Pulse: 79  Temp:   Resp: [address]16       Khelani Kops, PA-C 09/02/15 1505  Arby BarretteMarcy Pfeiffer, MD 09/02/15 1714

## 2015-09-02 NOTE — ED Notes (Signed)
Pt ambulate in hall with out any assistance

## 2015-09-02 NOTE — Discharge Instructions (Signed)
Upper Respiratory Infection, Adult Most upper respiratory infections (URIs) are a viral infection of the air passages leading to the lungs. A URI affects the nose, throat, and upper air passages. The most common type of URI is nasopharyngitis and is typically referred to as "the common cold." URIs run their course and usually go away on their own. Most of the time, a URI does not require medical attention, but sometimes a bacterial infection in the upper airways can follow a viral infection. This is called a secondary infection. Sinus and middle ear infections are common types of secondary upper respiratory infections. Bacterial pneumonia can also complicate a URI. A URI can worsen asthma and chronic obstructive pulmonary disease (COPD). Sometimes, these complications can require emergency medical care and may be life threatening.  CAUSES Almost all URIs are caused by viruses. A virus is a type of germ and can spread from one person to another.  RISKS FACTORS You may be at risk for a URI if:   You smoke.   You have chronic heart or lung disease.  You have a weakened defense (immune) system.   You are very young or very old.   You have nasal allergies or asthma.  You work in crowded or poorly ventilated areas.  You work in health care facilities or schools. SIGNS AND SYMPTOMS  Symptoms typically develop 2-3 days after you come in contact with a cold virus. Most viral URIs last 7-10 days. However, viral URIs from the influenza virus (flu virus) can last 14-18 days and are typically more severe. Symptoms may include:   Runny or stuffy (congested) nose.   Sneezing.   Cough.   Sore throat.   Headache.   Fatigue.   Fever.   Loss of appetite.   Pain in your forehead, behind your eyes, and over your cheekbones (sinus pain).  Muscle aches.  DIAGNOSIS  Your health care provider may diagnose a URI by:  Physical exam.  Tests to check that your symptoms are not due to  another condition such as:  Strep throat.  Sinusitis.  Pneumonia.  Asthma. TREATMENT  A URI goes away on its own with time. It cannot be cured with medicines, but medicines may be prescribed or recommended to relieve symptoms. Medicines may help:  Reduce your fever.  Reduce your cough.  Relieve nasal congestion. HOME CARE INSTRUCTIONS   Take medicines only as directed by your health care provider.   Gargle warm saltwater or take cough drops to comfort your throat as directed by your health care provider.  Use a warm mist humidifier or inhale steam from a shower to increase air moisture. This may make it easier to breathe.  Drink enough fluid to keep your urine clear or pale yellow.   Eat soups and other clear broths and maintain good nutrition.   Rest as needed.   Return to work when your temperature has returned to normal or as your health care provider advises. You may need to stay home longer to avoid infecting others. You can also use a face mask and careful hand washing to prevent spread of the virus.  Increase the usage of your inhaler if you have asthma.   Do not use any tobacco products, including cigarettes, chewing tobacco, or electronic cigarettes. If you need help quitting, ask your health care provider. PREVENTION  The best way to protect yourself from getting a cold is to practice good hygiene.   Avoid oral or hand contact with people with cold  symptoms.   Wash your hands often if contact occurs.  There is no clear evidence that vitamin C, vitamin E, echinacea, or exercise reduces the chance of developing a cold. However, it is always recommended to get plenty of rest, exercise, and practice good nutrition.  SEEK MEDICAL CARE IF:   You are getting worse rather than better.   Your symptoms are not controlled by medicine.   You have chills.  You have worsening shortness of breath.  You have brown or red mucus.  You have yellow or brown nasal  discharge.  You have pain in your face, especially when you bend forward.  You have a fever.  You have swollen neck glands.  You have pain while swallowing.  You have white areas in the back of your throat. SEEK IMMEDIATE MEDICAL CARE IF:   You have severe or persistent:  Headache.  Ear pain.  Sinus pain.  Chest pain.  You have chronic lung disease and any of the following:  Wheezing.  Prolonged cough.  Coughing up blood.  A change in your usual mucus.  You have a stiff neck.  You have changes in your:  Vision.  Hearing.  Thinking.  Mood. MAKE SURE YOU:   Understand these instructions.  Will watch your condition.  Will get help right away if you are not doing well or get worse.   This information is not intended to replace advice given to you by your health care provider. Make sure you discuss any questions you have with your health care provider.   Document Released: 04/11/2001 Document Revised: 03/02/2015 Document Reviewed: 01/21/2014 Elsevier Interactive Patient Education 2016 [employer] Dizziness Dizziness is a common problem. It is a feeling of unsteadiness or light-headedness. You may feel like you are about to faint. Dizziness can lead to injury if you stumble or fall. Anyone can become dizzy, but dizziness is more common in older adults. This condition can be caused by a number of things, including medicines, dehydration, or illness. HOME CARE INSTRUCTIONS Taking these steps may help with your condition: Eating and Drinking  Drink enough fluid to keep your urine clear or pale yellow. This helps to keep you from becoming dehydrated. Try to drink more clear fluids, such as water.  Do not drink alcohol.  Limit your caffeine intake if directed by your health care provider.  Limit your salt intake if directed by your health care provider. Activity  Avoid making quick movements.  Rise slowly from chairs and steady yourself until you feel  okay.  In the morning, first sit up on the side of the bed. When you feel okay, stand slowly while you hold onto something until you know that your balance is fine.  Move your legs often if you need to stand in one place for a long time. Tighten and relax your muscles in your legs while you are standing.  Do not drive or operate heavy machinery if you feel dizzy.  Avoid bending down if you feel dizzy. Place items in your home so that they are easy for you to reach without leaning over. Lifestyle  Do not use any tobacco products, including cigarettes, chewing tobacco, or electronic cigarettes. If you need help quitting, ask your health care provider.  Try to reduce your stress level, such as with yoga or meditation. Talk with your health care provider if you need help. General Instructions  Watch your dizziness for any changes.  Take medicines only as directed by your health care  provider. Talk with your health care provider if you think that your dizziness is caused by a medicine that you are taking.  Tell a friend or a family member that you are feeling dizzy. If he or she notices any changes in your behavior, have this person call your health care provider.  Keep all follow-up visits as directed by your health care provider. This is important. SEEK MEDICAL CARE IF:  Your dizziness does not go away.  Your dizziness or light-headedness gets worse.  You feel nauseous.  You have reduced hearing.  You have new symptoms.  You are unsteady on your feet or you feel like the room is spinning. SEEK IMMEDIATE MEDICAL CARE IF:  You vomit or have diarrhea and are unable to eat or drink anything.  You have problems talking, walking, swallowing, or using your arms, hands, or legs.  You feel generally weak.  You are not thinking clearly or you have trouble forming sentences. It may take a friend or family member to notice this.  You have chest pain, abdominal pain, shortness of  breath, or sweating.  Your vision changes.  You notice any bleeding.  You have a headache.  You have neck pain or a stiff neck.  You have a fever.   This information is not intended to replace advice given to you by your health care provider. Make sure you discuss any questions you have with your health care provider.   Document Released: 04/11/2001 Document Revised: 03/02/2015 Document Reviewed: 10/12/2014 Elsevier Interactive Patient Education [employer]

## 2015-09-02 NOTE — ED Notes (Signed)
Per report: Pt was at work this am (about 2 hours ago) and suddenly felt "lost" and dizzy.  C/O new RT ankle pain but denies any trauma as well as a "weird" feeling in chest that was "there one minute and gone the next."  Has a fleeting headache of 1/10 but "isn't a headache person."  GFD orthostatic vitals: 140/84 sitting, 122/76 standing.   EMS VS:138/80, 80NSR, 16, 99% RA, CBG 94.  Pt reports having had breakfast as well as pulling dog out from under his truck this am.  Denies N/V/D, diaphoresis, or shob.  States he is finishing course of abx and steroids "for my emphysema"

## 2015-09-02 NOTE — ED Notes (Signed)
Bed: WA03 Expected date:  Expected time:  Means of arrival:  Comments: 54yo dizziness

## 2015-09-07 ENCOUNTER — Encounter (HOSPITAL_COMMUNITY): Payer: Self-pay | Admitting: Emergency Medicine

## 2015-09-07 ENCOUNTER — Emergency Department (HOSPITAL_COMMUNITY)
Admission: EM | Admit: 2015-09-07 | Discharge: 2015-09-08 | Disposition: A | Discharge status: Other Acute Inpatient Hospital | Payer: Managed Care, Other (non HMO) | Attending: Emergency Medicine | Admitting: Emergency Medicine

## 2015-09-07 DIAGNOSIS — E785 Hyperlipidemia, unspecified: Secondary | ICD-10-CM | POA: Diagnosis not present

## 2015-09-07 DIAGNOSIS — F419 Anxiety disorder, unspecified: Secondary | ICD-10-CM | POA: Diagnosis not present

## 2015-09-07 DIAGNOSIS — Z79899 Other long term (current) drug therapy: Secondary | ICD-10-CM | POA: Diagnosis not present

## 2015-09-07 DIAGNOSIS — J439 Emphysema, unspecified: Secondary | ICD-10-CM | POA: Insufficient documentation

## 2015-09-07 DIAGNOSIS — F600 Paranoid personality disorder: Secondary | ICD-10-CM | POA: Insufficient documentation

## 2015-09-07 DIAGNOSIS — G479 Sleep disorder, unspecified: Secondary | ICD-10-CM | POA: Insufficient documentation

## 2015-09-07 DIAGNOSIS — R451 Restlessness and agitation: Secondary | ICD-10-CM | POA: Diagnosis not present

## 2015-09-07 DIAGNOSIS — F22 Delusional disorders: Secondary | ICD-10-CM | POA: Diagnosis present

## 2015-09-07 DIAGNOSIS — Z008 Encounter for other general examination: Secondary | ICD-10-CM | POA: Diagnosis present

## 2015-09-07 DIAGNOSIS — E079 Disorder of thyroid, unspecified: Secondary | ICD-10-CM | POA: Diagnosis not present

## 2015-09-07 DIAGNOSIS — Z720 Tobacco use: Secondary | ICD-10-CM | POA: Diagnosis not present

## 2015-09-07 HISTORY — DX: Emphysema, unspecified: J43.9

## 2015-09-07 LAB — URINALYSIS, ROUTINE W REFLEX MICROSCOPIC
BILIRUBIN URINE: NEGATIVE
Glucose, UA: NEGATIVE mg/dL
HGB URINE DIPSTICK: NEGATIVE
KETONES UR: NEGATIVE mg/dL
Leukocytes, UA: NEGATIVE
NITRITE: NEGATIVE
PH: 7 (ref 5.0–8.0)
Protein, ur: NEGATIVE mg/dL
Specific Gravity, Urine: 1.011 (ref 1.005–1.030)
UROBILINOGEN UA: 0.2 mg/dL (ref 0.0–1.0)

## 2015-09-07 LAB — CBC WITH DIFFERENTIAL/PLATELET
Basophils Absolute: 0 10*3/uL (ref 0.0–0.1)
Basophils Relative: 0 %
Eosinophils Absolute: 0.1 10*3/uL (ref 0.0–0.7)
Eosinophils Relative: 0 %
HEMATOCRIT: 43 % (ref 39.0–52.0)
HEMOGLOBIN: 14.3 g/dL (ref 13.0–17.0)
LYMPHS ABS: 3 10*3/uL (ref 0.7–4.0)
LYMPHS PCT: 18 %
MCH: 29.4 pg (ref 26.0–34.0)
MCHC: 33.3 g/dL (ref 30.0–36.0)
MCV: 88.3 fL (ref 78.0–100.0)
MONOS PCT: 7 %
Monocytes Absolute: 1.3 10*3/uL — ABNORMAL HIGH (ref 0.1–1.0)
NEUTROS ABS: 12.9 10*3/uL — AB (ref 1.7–7.7)
Neutrophils Relative %: 75 %
Platelets: 343 10*3/uL (ref 150–400)
RBC: 4.87 MIL/uL (ref 4.22–5.81)
RDW: 13.2 % (ref 11.5–15.5)
WBC: 17.3 10*3/uL — ABNORMAL HIGH (ref 4.0–10.5)

## 2015-09-07 LAB — COMPREHENSIVE METABOLIC PANEL
ALBUMIN: 3.9 g/dL (ref 3.5–5.0)
ALK PHOS: 70 U/L (ref 38–126)
ALT: 13 U/L — ABNORMAL LOW (ref 17–63)
ANION GAP: 6 (ref 5–15)
AST: 16 U/L (ref 15–41)
BUN: 8 mg/dL (ref 6–20)
CO2: 31 mmol/L (ref 22–32)
Calcium: 9.4 mg/dL (ref 8.9–10.3)
Chloride: 104 mmol/L (ref 101–111)
Creatinine, Ser: 0.73 mg/dL (ref 0.61–1.24)
GFR calc Af Amer: >60 mL/min (ref >60)
GFR calc non Af Amer: >60 mL/min (ref >60)
GLUCOSE: 64 mg/dL — AB (ref 65–99)
POTASSIUM: 3.5 mmol/L (ref 3.5–5.1)
SODIUM: 141 mmol/L (ref 135–145)
Total Bilirubin: 0.6 mg/dL (ref 0.3–1.2)
Total Protein: 7 g/dL (ref 6.5–8.1)

## 2015-09-07 LAB — ETHANOL: Alcohol, Ethyl (B): <5 mg/dL (ref <5)

## 2015-09-07 LAB — RAPID URINE DRUG SCREEN, HOSP PERFORMED
Amphetamines: NOT DETECTED
BARBITURATES: NOT DETECTED
Benzodiazepines: NOT DETECTED
Cocaine: NOT DETECTED
Opiates: NOT DETECTED
TETRAHYDROCANNABINOL: NOT DETECTED

## 2015-09-07 LAB — TSH: TSH: 1.729 u[IU]/mL (ref 0.350–4.500)

## 2015-09-07 MED ORDER — IBUPROFEN 200 MG PO TABS: 600.0000 mg | ORAL_TABLET | Freq: Three times a day (TID) | ORAL | Status: DC | PRN

## 2015-09-07 MED ORDER — ACETAMINOPHEN 325 MG PO TABS: 650.0000 mg | ORAL_TABLET | ORAL | Status: DC | PRN

## 2015-09-07 MED ORDER — DIAZEPAM 5 MG PO TABS: 2.5000 mg | ORAL_TABLET | Freq: Every evening | ORAL | Status: DC | PRN

## 2015-09-07 MED ORDER — ZOLPIDEM TARTRATE 5 MG PO TABS
5.0000 mg | ORAL_TABLET | Freq: Every evening | ORAL | Status: DC | PRN
  Administered 2015-09-07: 5 mg via ORAL
  Filled 2015-09-07: qty 1

## 2015-09-07 MED ORDER — LORAZEPAM 1 MG PO TABS
1.0000 mg | ORAL_TABLET | Freq: Three times a day (TID) | ORAL | Status: DC | PRN
  Administered 2015-09-07: 1 mg via ORAL
  Filled 2015-09-07: qty 1

## 2015-09-07 MED ORDER — ONDANSETRON HCL 4 MG PO TABS: 4.0000 mg | ORAL_TABLET | Freq: Three times a day (TID) | ORAL | Status: DC | PRN

## 2015-09-07 MED ORDER — LEVOTHYROXINE SODIUM 125 MCG PO TABS
125.0000 ug | ORAL_TABLET | Freq: Every day | ORAL | Status: DC
  Administered 2015-09-08: 125 ug via ORAL
  Filled 2015-09-07 (×2): qty 1

## 2015-09-07 MED ORDER — NICOTINE 21 MG/24HR TD PT24
21.0000 mg | MEDICATED_PATCH | Freq: Every day | TRANSDERMAL | Status: DC
  Administered 2015-09-07 – 2015-09-08 (×2): 21 mg via TRANSDERMAL
  Filled 2015-09-07 (×3): qty 1

## 2015-09-07 NOTE — ED Notes (Signed)
Patient sleeping upon assessment and does not appear to be in distress at this time. Will continue to monitor every 15 minutes for safety.

## 2015-09-07 NOTE — BH Assessment (Addendum)
Assessment Note  Joshua Mcknight is an 54 y.o. male who presents to [facility] voluntarily via [facility] for increasing paranoia. Today the patient came into the home where his wife and two grandchildren were and states that they needed to leave because someone was going to kill them. Patients mother and his wife was present with the patient as well. Patient was assessed alone. Patient states "back in April I was told to mind my own business and in June/July" he started feeling that people were chasing him. He states "I don't want to go to the cops, I want people to think I'm nuts." Patient asks "What do I need to say to make you think I'm nuts? I want to keep my family safe." patient states "there is a lot more of them than it is me." He will not provide any additional information on who is "out to get" him and why they may be after him. Patient states that he has "already said too much" and states that he wants to protect his family. Patient states that the phones in the home and his cell phone are "tapped" but his wife's cell phone is not being monitored. Patient states that he went to the pharmacy recently and the pharmacists knew his name which concerned him because he wonders how she knew his name. Patient endorses symptoms of depression as ; Insomnia;Tearfulness;Isolating;Loss of interest in usual pleasures and feeling worthless at times.  Patient denies current SI/HI and AVH but appears to be paranoid..  Patient states that he does not have previous inpatient or outpatient psychiatric history.Patient denies use of drugs and alcohol and patients UDS is clear and ETOH <5 upon arrival.   Patients wife was invited into the assessment and states that for the past few months she has noticed that the patient has been increasingly paranoid. Patients wife states "we live in [place] really bad neighborhood and there are a lot of drug dealers and stuff so he may have seen something, i don't know." Patient wife states that the patient  was talking to someone "in a red truck" recently and after the car left the patient seemed more paranoid. Patients wife states that she is afraid because she is not sure if someone has actually threatened her husband but states that besides the red truck she has not noticed any suspicious behavior. Patients wife confirmed that patient does not have a psychiatric history.  Patient is alert and oriented x4. Patient is calm and cooperative and was assessed alone. Patient speaks logically and coherently. Patient made fair eye contact and was dressed in scrubs sitting upright in a chair in a triage room. Patient states that he has been sleeping less since this started and "wakes up every hour" and sleeps about three hours per night. Patient states that his appetite is good.    Consulted with Alberteen Sam, NP who recommends inpatient treatment at this time.   Diagnosis: Major depressive disorder, Single episode, With psychotic features   Past Medical History:  Past Medical History  Diagnosis Date  . Hyperlipidemia   . Thyroid disease   . Emphysema of lung [facility])     Past Surgical History  Procedure Laterality Date  . Knee surgery Left age 34    Family History:  Family History  Problem Relation Age of Onset  . CAD Father     CABG at 66  . Aneurysm Father     Social History:  reports that he has been smoking Cigarettes.  He has  a 64 pack-year smoking history. He has never used smokeless tobacco. He reports that he does not drink alcohol or use illicit drugs.  Additional Social History:  Alcohol / Drug Use Pain Medications: See PTA Prescriptions: See PTA Over the Counter: See PTA  CIWA: CIWA-Ar BP: 117/76 mmHg Pulse Rate: 78 COWS:    Allergies:  Allergies  Allergen Reactions  . Bee Venom Swelling    Swelling at site   . Coconut Oil Rash    Home Medications:  (Not in a hospital admission)  OB/GYN Status:  No LMP for male patient.  General Assessment Data Location of  Assessment: [facility] TTS Assessment: In system Is this a Tele or Face-to-Face Assessment?: Face-to-Face Is this an Initial Assessment or a Re-assessment for this encounter?: Initial Assessment Marital status: Married (25 years) Living Arrangements: Alone, Spouse/significant other Can pt return to current living arrangement?: Yes Admission Status: Involuntary Is patient capable of signing voluntary admission?: Yes Referral Source: Self/Family/Friend Insurance type: Medical sales representative[employer]igna     Crisis Care Plan Living Arrangements: Alone, Spouse/significant other Name of Psychiatrist: None Name of Therapist: None  Education Status Is patient currently in school?: No Highest grade of school patient has completed: Certificate  Risk to self with the past 6 months Suicidal Ideation: No Has patient been a risk to self within the past 6 months prior to admission? : No Suicidal Intent: No Has patient had any suicidal intent within the past 6 months prior to admission? : No Is patient at risk for suicide?: No Suicidal Plan?: No Has patient had any suicidal plan within the past 6 months prior to admission? : No Access to Means: No What has been your use of drugs/alcohol within the last 12 months?: Denies Previous Attempts/Gestures: No How many times?: 0 Other Self Harm Risks: Denies Triggers for Past Attempts: None known Intentional Self Injurious Behavior: None Family Suicide History: Unknown Recent stressful life event(s): Other (Comment) ("thinking people are out to kill me") Persecutory voices/beliefs?: No Depression: Yes Depression Symptoms: Insomnia, Tearfulness, Isolating, Loss of interest in usual pleasures, Feeling worthless/self pity Substance abuse history and/or treatment for substance abuse?: No  Risk to Others within the past 6 months Homicidal Ideation: No Does patient have any lifetime risk of violence toward others beyond the six months prior to admission? : No Thoughts of Harm to  Others: No Current Homicidal Intent: No Current Homicidal Plan: No Access to Homicidal Means: No Identified Victim: Denies History of harm to others?: No Assessment of Violence: None Noted Violent Behavior Description: Denies Does patient have access to weapons?: No Criminal Charges Pending?: No Does patient have a court date: No Is patient on probation?: No  Psychosis Hallucinations: None noted Delusions: Unspecified  Mental Status Report Appearance/Hygiene: In scrubs Eye Contact: Fair Motor Activity: Freedom of movement Speech: Logical/coherent Level of Consciousness: Alert Mood: Pleasant Affect: Appropriate to circumstance Anxiety Level: None Thought Processes: Coherent, Relevant Judgement: Partial Orientation: Person, Place, Time, Situation, Appropriate for developmental age Obsessive Compulsive Thoughts/Behaviors: None  Cognitive Functioning Concentration: Decreased Memory: Recent Intact, Remote Intact IQ: Average Insight: Fair Impulse Control: Fair Appetite: Fair Sleep: Decreased Total Hours of Sleep: 3 Vegetative Symptoms: Unable to Assess  ADLScreening [facility] Assessment Services) Patient's cognitive ability adequate to safely complete daily activities?: Yes Patient able to express need for assistance with ADLs?: Yes Independently performs ADLs?: Yes (appropriate for developmental age)  Prior Inpatient Therapy Prior Inpatient Therapy: No Prior Therapy Dates: N/A Prior Therapy Facilty/Provider(s): N/A Reason for Treatment: N/A  Prior  Outpatient Therapy Prior Outpatient Therapy: No Prior Therapy Dates: N/A Prior Therapy Facilty/Provider(s): N/A Reason for Treatment: N/A Does patient have an ACCT team?: No Does patient have Intensive In-House Services?  : No Does patient have [facility] services? : No Does patient have P4CC services?: No  ADL Screening (condition at time of admission) Patient's cognitive ability adequate to safely complete daily  activities?: Yes Is the patient deaf or have difficulty hearing?: No Does the patient have difficulty seeing, even when wearing glasses/contacts?: No Does the patient have difficulty concentrating, remembering, or making decisions?: No Patient able to express need for assistance with ADLs?: Yes Does the patient have difficulty dressing or bathing?: No Independently performs ADLs?: Yes (appropriate for developmental age) Does the patient have difficulty walking or climbing stairs?: No Weakness of Legs: None Weakness of Arms/Hands: None  Home Assistive Devices/Equipment Home Assistive Devices/Equipment: None  Therapy Consults (therapy consults require a physician order) PT Evaluation Needed: No OT Evalulation Needed: No SLP Evaluation Needed: No Abuse/Neglect Assessment (Assessment to be complete while patient is alone) Physical Abuse: Denies Verbal Abuse: Denies Sexual Abuse: Denies Exploitation of patient/patient's resources: Denies Self-Neglect: Denies Values / Beliefs Cultural Requests During Hospitalization: None Spiritual Requests During Hospitalization: None Consults Spiritual Care Consult Needed: No Social Work Consult Needed: No Merchant navy officer (For Healthcare) Does patient have an advance directive?: No Would patient like information on creating an advanced directive?: No - patient declined information    Additional Information 1:1 In Past 12 Months?: No CIRT Risk: No Elopement Risk: No Does patient have medical clearance?: Yes     Disposition:  Disposition Initial Assessment Completed for this Encounter: Yes Disposition of Patient: Inpatient treatment program Type of inpatient treatment program: Adult  On Site Evaluation by:   Reviewed with Physician:    Ardis Fullwood 09/07/2015 6:45 PM

## 2015-09-07 NOTE — ED Notes (Signed)
Pt. Family took belongings.

## 2015-09-07 NOTE — ED Notes (Signed)
Charted in error.

## 2015-09-07 NOTE — ED Notes (Signed)
Family members have all of pts belongings. Pt does have glasses with self.

## 2015-09-07 NOTE — ED Provider Notes (Signed)
CSN: [id number]     Arrival date & time 09/07/15  1230 History   First MD Initiated Contact with Patient 09/07/15 1414     Chief Complaint  Patient presents with  . Psychiatric Evaluation     (Consider location/radiation/quality/duration/timing/severity/associated sxs/prior Treatment) HPI Lavell LusterDonald Lafont is a 54 y.o. male with history of emphysema, hyperlipidemia, thyroid disease, presents to emergency department complaining of paranoid thoughts. Family is at bedside. Family states that over last few weeks patient has had increased paranoia. Patient is constantly thinking that someone is following him or someone is out to get him and his family. Today he told his family that they need to get out of the house because someone is coming to kill them all. He was seen by his doctor yesterday and prescribed depression medication which he went to pick up today. He states when he got to the pharmacy pharmacist knew his name and he states that he thinks that the pharmacist is conspiring against him and he did not trust her are the medication she was dispensing. Patient states that he believes the drug dealers and "junkies" are out to get him. When I asked him why he stated that "to keep my mouth shut." Patient denies any suicidal or homicidal ideations except for against those were out to get him.  Past Medical History  Diagnosis Date  . Hyperlipidemia   . Thyroid disease   . Emphysema of lung [facility](HCC)    Past Surgical History  Procedure Laterality Date  . Knee surgery Left age [age]   Family History  Problem Relation Age of Onset  . CAD Father     CABG at 2245  . Aneurysm Father    Social History  Substance Use Topics  . Smoking status: Current Every Day Smoker -- 2.00 packs/day for 32 years    Types: Cigarettes  . Smokeless tobacco: Never Used     Comment: 1 ppd (01/28/2014)  . Alcohol Use: No    Review of Systems  Constitutional: Negative for fever and chills.  Respiratory: Negative for cough,  chest tightness and shortness of breath.   Cardiovascular: Negative for chest pain, palpitations and leg swelling.  Musculoskeletal: Negative for myalgias, arthralgias, neck pain and neck stiffness.  Skin: Negative for rash.  Allergic/Immunologic: Negative for immunocompromised state.  Neurological: Negative for dizziness, weakness, light-headedness, numbness and headaches.  Psychiatric/Behavioral: Positive for sleep disturbance and agitation. The patient is nervous/anxious.       Allergies  Bee venom and Coconut oil  Home Medications   Prior to Admission medications   Medication Sig Start Date End Date Taking? Authorizing Provider  diazepam (VALIUM) 5 MG tablet Take 2.5-5 mg by mouth at bedtime as needed (for sleep).   Yes Historical Provider, MD  flintstones complete (FLINTSTONES) 60 MG chewable tablet Chew 1 tablet by mouth daily.   Yes Historical Provider, MD  levothyroxine (SYNTHROID, LEVOTHROID) 125 MCG tablet Take 125 mcg by mouth daily before breakfast.   Yes Historical Provider, MD  predniSONE (STERAPRED UNI-PAK 48 TAB) 10 MG (48) TBPK tablet Take by mouth as directed. Takes as directed for 12 days  Started on 10/21 08/20/15  Yes Historical Provider, MD  atorvastatin (LIPITOR) 80 MG tablet Take 1 tablet (80 mg total) by mouth daily. Patient not taking: Reported on 09/02/2015 02/09/14   Chrystie NoseKenneth C Hilty, MD  levofloxacin (LEVAQUIN) 500 MG tablet Take 1 tablet (500 mg total) by mouth daily. Patient not taking: Reported on 09/07/2015 09/02/15   Marlon Peliffany Greene, PA-C  SYNTHROID 150 MCG tablet TAKE 1 TABLET BY MOUTH EVERY DAY Patient not taking: Reported on 09/02/2015 11/14/13   Shelva Majestic, MD   BP 107/73 mmHg  Pulse 82  Temp(Src) 98 F (36.7 C) (Oral)  Resp 20  SpO2 100% Physical Exam  Constitutional: He is oriented to person, place, and time. He appears well-developed and well-nourished. No distress.  HENT:  Head: Normocephalic and atraumatic.  Eyes: Conjunctivae are  normal.  Neck: Neck supple.  Cardiovascular: Normal rate, regular rhythm and normal heart sounds.   Pulmonary/Chest: Effort normal. No respiratory distress. He has no wheezes. He has no rales.  Abdominal: Soft. Bowel sounds are normal. He exhibits no distension. There is no tenderness. There is no rebound.  Musculoskeletal: He exhibits no edema.  Neurological: He is alert and oriented to person, place, and time.  Skin: Skin is warm and dry.  Psychiatric:  Avoiding eye contact. Admits to paranoid thoughts. No SI or HI.  Nursing note and vitals reviewed.   ED Course  Procedures (including critical care time) Labs Review Labs Reviewed  COMPREHENSIVE METABOLIC PANEL - Abnormal; Notable for the following:    Glucose, Bld 64 (*)    ALT 13 (*)    All other components within normal limits  CBC WITH DIFFERENTIAL/PLATELET - Abnormal; Notable for the following:    WBC 17.3 (*)    Neutro Abs 12.9 (*)    Monocytes Absolute 1.3 (*)    All other components within normal limits  T4, FREE - Abnormal; Notable for the following:    Free T4 1.13 (*)    All other components within normal limits  ETHANOL  URINE RAPID DRUG SCREEN, HOSP PERFORMED  URINALYSIS, ROUTINE W REFLEX MICROSCOPIC (NOT AT [facility])  TSH    Imaging Review No results found. I have personally reviewed and evaluated these images and lab results as part of my medical decision-making.   EKG Interpretation None      MDM   Final diagnoses:  None    patient with worsening paranoid behavior last several weeks. He recently had a visit and had negative CT head, chest. Unremarkable blood work. Today vital signs are normal. Labs repeated, unremarkable except for leukocytosis. He is afebrile, denies any signs of any infectious process. He is currently on prednisone, which could explain this. We'll get TTS assessment.   Pt signed out at shift change pending tts assessment.   Filed Vitals:   09/07/15 1754 09/07/15 2130 09/08/15 0631  09/08/15 1501  BP: 117/76 102/67 112/75 106/68  Pulse: 78 68 80 87  Temp: 97.8 F (36.6 C) 98.1 F (36.7 C) 98.1 F (36.7 C) 98.4 F (36.9 C)  TempSrc: Oral Oral Oral   Resp: SpO2: 99% 99% 94% 100%      Jaynie Crumble, PA-C 09/11/15 0113  Gwyneth Sprout, MD 09/15/15 8295

## 2015-09-07 NOTE — ED Notes (Signed)
Pt brought in by [employer], pt states people are trying to kill him. States " I know exactly who they are but I cannot tell you that." Pt denies SI but states he does feel like hurting the people that are trying to kill him. Pt states " I just want to be left alone but they won't leave me alone."

## 2015-09-07 NOTE — BH Assessment (Signed)
Assessment completed.  Consulted with Alberteen SamFran Hobson, NP who recommends inpatient treatment at this time.  Informed EDP of recommendation.   Davina PokeJoVea Devron Cohick, LCSW Therapeutic Triage Specialist [facility] 09/07/2015 4:09 PM

## 2015-09-07 NOTE — Progress Notes (Signed)
Patient alert and orientedx4. Patient is anxious and visited with mother and daughter. He stated he didn't want to get comfortable but then I explained that he had to spend the night and then talk to the doctor. He has been cooperative. Nicoderm patch was provided.

## 2015-09-08 DIAGNOSIS — F22 Delusional disorders: Secondary | ICD-10-CM | POA: Diagnosis present

## 2015-09-08 LAB — T4, FREE: FREE T4: 1.13 ng/dL — AB (ref 0.61–1.12)

## 2015-09-08 NOTE — BH Assessment (Signed)
[facility] Assessment Progress Note  Per Thedore MinsMojeed Akintayo, MD, this pt requires psychiatric hospitalization at this time.  Christiane HaJonathan calls from [facility] to report that pt has been accepted to their facility by Dr. Wendall StadeKohl to [facility]terEm[facility].  He asks for report to be called to [phone].  Pt is currently under voluntary status.  This Clinical research associatewriter spoke to pt; with pt's verbal consent pt's wife remained in room during conversation.  Pt and wife are both adamant that they do not want pt to be [facility].  They are not agreeable to any other reasonable alternatives for placement.  This Clinical research associatewriter then spoke to Thedore MinsMojeed Akintayo, MD who has decided to initiate IVC.  Commitment documents have been drafted and faxed to the magistrate, and Hart CarwinMagistrate Haynes has confirmed receipt.  Petition and First Examination have been faxed to [facility].  Pt's nurse has been notified.  [facility]  Doylene Canninghomas Jakira Mcfadden, MA Triage Specialist [phone]

## 2015-09-08 NOTE — ED Notes (Signed)
Patient discharged with sheriff dept. Patient currently denies SI/HI/AVH. Patient belongings given to [employer] [facility] deputy. Discharge information explained to patient.

## 2015-09-08 NOTE — Consult Note (Signed)
[place] [facility] Consult   Reason for Consult:  Paranoia, Psychosis Referring Physician:  EDP Patient Identification: Joshua Mcknight MRN:  [id number] Principal Diagnosis: Acute paranoia [facility]) Diagnosis:   Patient Active Problem List   Diagnosis Date Noted  . Acute paranoia ([place]) [F22] 09/08/2015  . Chest pressure [R07.89] 02/04/2014  . Elevated coronary artery calcium score [I25.10] 02/04/2014  . Hemoptysis [R04.2] 05/15/2013  . Poor dentition [K08.9] 05/15/2013  . NSAID long-term use [Z79.1] 12/05/2012  . Leg swelling [M79.89] 09/24/2012  . Back pain [M54.9] 09/24/2012  . Unintentional weight loss [R63.4] 02/08/2012  . Routine health maintenance [Z00.00] 09/18/2011  . HEMATURIA UNSPECIFIED [R31.9] 05/03/2010  . TINNITUS, CHRONIC, BILATERAL [H93.19] 02/12/2009  . 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 HYPERCHOLESTEROLEMIA [E78.00] 08/28/2007  . TOBACCO ABUSE [F17.200] 08/28/2007    Total Time spent with patient: 45 minutes  Subjective:   Joshua Mcknight is a 54 y.o. male patient admitted with Paranoia, Psychosis.  HPI:  Caucasian male, 54 years old was evaluated for new onset Paranoia.  Patient was brought in by his family when he asked every members of his household to pack up and leave because the were going to be killed.  Patient reports that he has been having this Paranoia for 3 months.  He denies previous Psychiatric illness and denies ever been seen by a Psychiatrist and have never been on any Psychotropic  Medications.  Patient admits to living in a drug infested neighborhood and stated that the drug dealers are angry at him for reporting them to the Police.  Patient reports that he wanted to protect his family from harm and had asked everybody to move away with him.  He also stated that he is taking Prednisone 10 mg po daily for exacerbation of Emphysema.  Patient states that his wife does not agree with him that their life is in danger.   Wife, who came  to visit with patient stated that her husband  Is suffering from "something I cannot make out"  She states that nobody is coming after them although their neighborhood is not the best environment.  She also stated that  Her husband did not confront any drug dealer and did not report any drug user.  Patient's wife stated that her husband have never suffered from mental illness in the past. Patient has been accepted at [facility] by Dr Dareen Piano and is waiting to be served his IVC paper taken out by Dr Darleene Cleaver.  Patient declined admission and has been IVC at this time.   Past Psychiatric History: Denies  Risk to Self: Suicidal Ideation: No Suicidal Intent: No Is patient at risk for suicide?: No Suicidal Plan?: No Access to Means: No What has been your use of drugs/alcohol within the last 12 months?: Denies How many times?: 0 Other Self Harm Risks: Denies Triggers for Past Attempts: None known Intentional Self Injurious Behavior: None Risk to Others: Homicidal Ideation: No Thoughts of Harm to Others: No Current Homicidal Intent: No Current Homicidal Plan: No Access to Homicidal Means: No Identified Victim: Denies History of harm to others?: No Assessment of Violence: None Noted Violent Behavior Description: Denies Does patient have access to weapons?: No Criminal Charges Pending?: No Does patient have a court date: No Prior Inpatient Therapy: Prior Inpatient Therapy: No Prior Therapy Dates: N/A Prior Therapy Facilty/Provider(s): N/A Reason for Treatment: N/A Prior Outpatient Therapy: Prior Outpatient Therapy: No Prior Therapy Dates: N/A Prior Therapy Facilty/Provider(s): N/A Reason for Treatment: N/A Does patient have  an ACCT team?: No Does patient have Intensive In-House Services?  : No Does patient have [facility] services? : No Does patient have P4CC services?: No  Past Medical History:  Past Medical History  Diagnosis Date  . Hyperlipidemia   . Thyroid disease   . Emphysema  of lung [facility])     Past Surgical History  Procedure Laterality Date  . Knee surgery Left age 33   Family History:  Family History  Problem Relation Age of Onset  . CAD Father     CABG at 16  . Aneurysm Father    Family Psychiatric  History: Father had an unknown Psychiatric illness when patient was a kid.  Father was admitted for 2 week at a Psychiatric hospital Social History:  History  Alcohol Use No     History  Drug Use No    Social History   Social History  . Marital Status: Married    Spouse Name: N/A  . Number of Children: N/A  . Years of Education: N/A   Social History Main Topics  . Smoking status: Current Every Day Smoker -- 2.00 packs/day for 32 years    Types: Cigarettes  . Smokeless tobacco: Never Used     Comment: 1 ppd (01/28/2014)  . Alcohol Use: No  . Drug Use: No  . Sexual Activity: Not Asked   Other Topics Concern  . None   Social History Narrative   Additional Social History:    Pain Medications: See PTA Prescriptions: See PTA Over the Counter: See PTA                     Allergies:   Allergies  Allergen Reactions  . Bee Venom Swelling    Swelling at site   . Coconut Oil Rash    Labs:  Results for orders placed or performed during the hospital encounter of 09/07/15 (from the past 48 hour(s))  Comprehensive metabolic panel     Status: Abnormal   Collection Time: 09/07/15  2:05 PM  Result Value Ref Range   Sodium 141 135 - 145 mmol/L   Potassium 3.5 3.5 - 5.1 mmol/L   Chloride 104 101 - 111 mmol/L   CO2 31 22 - 32 mmol/L   Glucose, Bld 64 (L) 65 - 99 mg/dL   BUN 8 6 - 20 mg/dL   Creatinine, Ser 0.73 0.61 - 1.24 mg/dL   Calcium 9.4 8.9 - 10.3 mg/dL   Total Protein 7.0 6.5 - 8.1 g/dL   Albumin 3.9 3.5 - 5.0 g/dL   AST 16 15 - 41 U/L   ALT 13 (L) 17 - 63 U/L   Alkaline Phosphatase 70 38 - 126 U/L   Total Bilirubin 0.6 0.3 - 1.2 mg/dL   GFR calc non Af Amer >60 >60 mL/min   GFR calc Af Amer >60 >60 mL/min    Comment:  (NOTE) The eGFR has been calculated using the CKD EPI equation. This calculation has not been validated in all clinical situations. eGFR's persistently <60 mL/min signify possible Chronic Kidney Disease.    Anion gap 6 5 - 15  Ethanol     Status: None   Collection Time: 09/07/15  2:05 PM  Result Value Ref Range   Alcohol, Ethyl (B) <5 <5 mg/dL    Comment:        LOWEST DETECTABLE LIMIT FOR SERUM ALCOHOL IS 5 mg/dL FOR MEDICAL PURPOSES ONLY   CBC with Diff     Status: Abnormal  Collection Time: 09/07/15  2:05 PM  Result Value Ref Range   WBC 17.3 (H) 4.0 - 10.5 K/uL   RBC 4.87 4.22 - 5.81 MIL/uL   Hemoglobin 14.3 13.0 - 17.0 g/dL   HCT 43.0 39.0 - 52.0 %   MCV 88.3 78.0 - 100.0 fL   MCH 29.4 26.0 - 34.0 pg   MCHC 33.3 30.0 - 36.0 g/dL   RDW 13.2 11.5 - 15.5 %   Platelets 343 150 - 400 K/uL   Neutrophils Relative % 75 %   Neutro Abs 12.9 (H) 1.7 - 7.7 K/uL   Lymphocytes Relative 18 %   Lymphs Abs 3.0 0.7 - 4.0 K/uL   Monocytes Relative 7 %   Monocytes Absolute 1.3 (H) 0.1 - 1.0 K/uL   Eosinophils Relative 0 %   Eosinophils Absolute 0.1 0.0 - 0.7 K/uL   Basophils Relative 0 %   Basophils Absolute 0.0 0.0 - 0.1 K/uL  Urine rapid drug screen (hosp performed)not at [facility]     Status: None   Collection Time: 09/07/15  2:08 PM  Result Value Ref Range   Opiates NONE DETECTED NONE DETECTED   Cocaine NONE DETECTED NONE DETECTED   Benzodiazepines NONE DETECTED NONE DETECTED   Amphetamines NONE DETECTED NONE DETECTED   Tetrahydrocannabinol NONE DETECTED NONE DETECTED   Barbiturates NONE DETECTED NONE DETECTED    Comment:        DRUG SCREEN FOR MEDICAL PURPOSES ONLY.  IF CONFIRMATION IS NEEDED FOR ANY PURPOSE, NOTIFY LAB WITHIN 5 DAYS.        LOWEST DETECTABLE LIMITS FOR URINE DRUG SCREEN Drug Class       Cutoff (ng/mL) Amphetamine      1000 Barbiturate      200 Benzodiazepine   032 Tricyclics       122 Opiates          300 Cocaine          300 THC              50    Urinalysis, Routine w reflex microscopic (not at [facility])     Status: None   Collection Time: 09/07/15  2:56 PM  Result Value Ref Range   Color, Urine YELLOW YELLOW   APPearance CLEAR CLEAR   Specific Gravity, Urine 1.011 1.005 - 1.030   pH 7.0 5.0 - 8.0   Glucose, UA NEGATIVE NEGATIVE mg/dL   Hgb urine dipstick NEGATIVE NEGATIVE   Bilirubin Urine NEGATIVE NEGATIVE   Ketones, ur NEGATIVE NEGATIVE mg/dL   Protein, ur NEGATIVE NEGATIVE mg/dL   Urobilinogen, UA 0.2 0.0 - 1.0 mg/dL   Nitrite NEGATIVE NEGATIVE   Leukocytes, UA NEGATIVE NEGATIVE    Comment: MICROSCOPIC NOT DONE ON URINES WITH NEGATIVE PROTEIN, BLOOD, LEUKOCYTES, NITRITE, OR GLUCOSE <1000 mg/dL.  TSH     Status: None   Collection Time: 09/07/15  8:00 PM  Result Value Ref Range   TSH 1.729 0.350 - 4.500 uIU/mL  T4, free     Status: Abnormal   Collection Time: 09/07/15  8:00 PM  Result Value Ref Range   Free T4 1.13 (H) 0.61 - 1.12 ng/dL    Comment: Performed at [facility]    Current Facility-Administered Medications  Medication Dose Route Frequency Provider Last Rate Last Dose  . acetaminophen (TYLENOL) tablet 650 mg  650 mg Oral Q4H PRN Tatyana Kirichenko, PA-C      . diazepam (VALIUM) tablet 2.5-5 mg  2.5-5 mg Oral QHS PRN Jeannett Senior,  PA-C      . ibuprofen (ADVIL,MOTRIN) tablet 600 mg  600 mg Oral Q8H PRN Tatyana Kirichenko, PA-C      . levothyroxine (SYNTHROID, LEVOTHROID) tablet 125 mcg  125 mcg Oral QAC breakfast Tatyana Kirichenko, PA-C   125 mcg at 09/08/15 0725  . LORazepam (ATIVAN) tablet 1 mg  1 mg Oral Q8H PRN Tatyana Kirichenko, PA-C   1 mg at 09/07/15 2136  . nicotine (NICODERM CQ - dosed in mg/24 hours) patch 21 mg  21 mg Transdermal Daily Tatyana Kirichenko, PA-C   21 mg at 09/07/15 1758  . ondansetron (ZOFRAN) tablet 4 mg  4 mg Oral Q8H PRN Tatyana Kirichenko, PA-C      . zolpidem (AMBIEN) tablet 5 mg  5 mg Oral QHS PRN Tatyana Kirichenko, PA-C   5 mg at 09/07/15 2136   Current Outpatient  Prescriptions  Medication Sig Dispense Refill  . diazepam (VALIUM) 5 MG tablet Take 2.5-5 mg by mouth at bedtime as needed (for sleep).    . flintstones complete (FLINTSTONES) 60 MG chewable tablet Chew 1 tablet by mouth daily.    Marland Kitchen levothyroxine (SYNTHROID, LEVOTHROID) 125 MCG tablet Take 125 mcg by mouth daily before breakfast.    . predniSONE (STERAPRED UNI-PAK 48 TAB) 10 MG (48) TBPK tablet Take by mouth as directed. Takes as directed for 12 days  Started on 10/21  0  . atorvastatin (LIPITOR) 80 MG tablet Take 1 tablet (80 mg total) by mouth daily. (Patient not taking: Reported on 09/02/2015) 30 tablet 6  . levofloxacin (LEVAQUIN) 500 MG tablet Take 1 tablet (500 mg total) by mouth daily. (Patient not taking: Reported on 09/07/2015) 5 tablet 0  . SYNTHROID 150 MCG tablet TAKE 1 TABLET BY MOUTH EVERY DAY (Patient not taking: Reported on 09/02/2015) 30 tablet 0    Musculoskeletal: Strength & Muscle Tone: within normal limits Gait & Station: normal Patient leans: N/A  Psychiatric Specialty Exam: Review of Systems  Constitutional: Negative.   HENT: Negative.   Eyes: Negative.   Respiratory:       Hx of emphysema  Cardiovascular: Negative.   Gastrointestinal: Negative.   Genitourinary: Negative.   Musculoskeletal: Negative.   Skin: Negative.   Neurological: Negative.   Endo/Heme/Allergies: Negative.     Blood pressure 112/75, pulse 80, temperature 98.1 F (36.7 C), temperature source Oral, resp. rate 18, SpO2 94 %.There is no weight on file to calculate BMI.  General Appearance: Casual  Eye Contact::  Good  Speech:  Clear and Coherent and Normal Rate  Volume:  Normal  Mood:  Anxious  Affect:  Congruent  Thought Process:  Linear  Orientation:  Full (Time, Place, and Person)  Thought Content:  Delusions and Paranoid Ideation  Suicidal Thoughts:  No  Homicidal Thoughts:  No  Memory:  Immediate;   Fair Recent;   Fair Remote;   Fair  Judgement:  Fair  Insight:  Lacking   Psychomotor Activity:  Normal  Concentration:  Fair  Recall:  [employer] of Knowledge:Fair  Language: Good  Akathisia:  NA  Handed:  Right  AIMS (if indicated):     Assets:  Desire for Improvement  ADL's:  Intact  Cognition: WNL  Sleep:      Treatment Plan Summary: Daily contact with patient to assess and evaluate symptoms and progress in treatment and Medication management  Disposition: Accepted for admission at [facility], will transport as soon as transportation becomes available.  Delfin Gant   PMHNP-BC 09/08/2015  1:19 PM Patient seen face-to-face for psychiatric evaluation, chart reviewed and case discussed with the physician extender and developed treatment plan. Reviewed the information documented and agree with the treatment plan. Corena Pilgrim, MD

## 2015-09-16 ENCOUNTER — Emergency Department (INDEPENDENT_AMBULATORY_CARE_PROVIDER_SITE_OTHER)
Admission: EM | Admit: 2015-09-16 | Discharge: 2015-09-16 | Disposition: A | Discharge status: Home / Self Care | Payer: Managed Care, Other (non HMO) | Source: Home / Self Care | Attending: Family Medicine | Admitting: Family Medicine

## 2015-09-16 ENCOUNTER — Emergency Department (INDEPENDENT_AMBULATORY_CARE_PROVIDER_SITE_OTHER): Payer: Managed Care, Other (non HMO)

## 2015-09-16 ENCOUNTER — Encounter (HOSPITAL_COMMUNITY): Payer: Self-pay | Admitting: *Deleted

## 2015-09-16 DIAGNOSIS — J432 Centrilobular emphysema: Secondary | ICD-10-CM

## 2015-09-16 DIAGNOSIS — R0789 Other chest pain: Secondary | ICD-10-CM

## 2015-09-16 NOTE — Discharge Instructions (Signed)
advil for chest pain and see your doctor on fri for recheck.

## 2015-09-16 NOTE — ED Notes (Signed)
Pt  Reports  Pain  r  Side  r lower  Lung  Hurts  To take  A  Deep  Breath    Pt     Reports   [facility]  [facility]    At   [facility]ental  hosp           Pt  Has  A  History  Of    Copd

## 2015-09-16 NOTE — ED Provider Notes (Signed)
CSN: [id number]     Arrival date & time 09/16/15  1346 History   First MD Initiated Contact with Patient 09/16/15 1403     Chief Complaint  Patient presents with  . Emphysema  . Chest Pain   (Consider location/radiation/quality/duration/timing/severity/associated sxs/prior Treatment) Patient is a 54 y.o. male presenting with chest pain. The history is provided by the patient and the spouse.  Chest Pain Pain location:  R chest Pain quality: sharp   Pain radiates to:  Does not radiate Pain radiates to the back: no   Context: movement   Context comment:  Just released from psych hosp today, here for cxr for cp eval. Relieved by:  None tried Worsened by:  Nothing tried Ineffective treatments:  None tried Associated symptoms: altered mental status and shortness of breath   Associated symptoms: no abdominal pain, no fever and no palpitations   Associated symptoms comment:  No smoking for 3days.   Past Medical History  Diagnosis Date  . Hyperlipidemia   . Thyroid disease   . Emphysema of lung [facility](HCC)    Past Surgical History  Procedure Laterality Date  . Knee surgery Left age 54   Family History  Problem Relation Age of Onset  . CAD Father     CABG at 945  . Aneurysm Father    Social History  Substance Use Topics  . Smoking status: Current Every Day Smoker -- 2.00 packs/day for 32 years    Types: Cigarettes  . Smokeless tobacco: Never Used     Comment: 1 ppd (01/28/2014)  . Alcohol Use: No    Review of Systems  Constitutional: Negative.  Negative for fever.  HENT: Negative.   Respiratory: Positive for shortness of breath. Negative for wheezing.   Cardiovascular: Positive for chest pain. Negative for palpitations and leg swelling.  Gastrointestinal: Negative for abdominal pain.  All other systems reviewed and are negative.   Allergies  Bee venom and Coconut oil  Home Medications   Prior to Admission medications   Medication Sig Start Date End Date Taking?  Authorizing Provider  atorvastatin (LIPITOR) 80 MG tablet Take 1 tablet (80 mg total) by mouth daily. Patient not taking: Reported on 09/02/2015 02/09/14   Chrystie NoseKenneth C Hilty, MD  diazepam (VALIUM) 5 MG tablet Take 2.5-5 mg by mouth at bedtime as needed (for sleep).    Historical Provider, MD  flintstones complete (FLINTSTONES) 60 MG chewable tablet Chew 1 tablet by mouth daily.    Historical Provider, MD  levofloxacin (LEVAQUIN) 500 MG tablet Take 1 tablet (500 mg total) by mouth daily. Patient not taking: Reported on 09/07/2015 09/02/15   Marlon Peliffany Greene, PA-C  levothyroxine (SYNTHROID, LEVOTHROID) 125 MCG tablet Take 125 mcg by mouth daily before breakfast.    Historical Provider, MD  predniSONE (STERAPRED UNI-PAK 48 TAB) 10 MG (48) TBPK tablet Take by mouth as directed. Takes as directed for 12 days  Started on 10/21 08/20/15   Historical Provider, MD   Meds Ordered and Administered this Visit  Medications - No data to display  BP 120/72 mmHg  Pulse 95  Temp(Src) 98.3 F (36.8 C) (Oral)  SpO2 98% No data found.   Physical Exam  Constitutional: He is oriented to person, place, and time. He appears well-developed and well-nourished. No distress.  HENT:  Mouth/Throat: Oropharynx is clear and moist.  Eyes: Conjunctivae are normal. Pupils are equal, round, and reactive to light.  Neck: Normal range of motion. Neck supple.  Cardiovascular: Normal heart sounds and intact  distal pulses.   Pulmonary/Chest: Effort normal. He has decreased breath sounds. He has no wheezes. He has no rhonchi. He has no rales. He exhibits tenderness.  Neurological: He is alert and oriented to person, place, and time.  Skin: Skin is warm and dry.  Nursing note and vitals reviewed.   ED Course  Procedures (including critical care time)  Labs Review Labs Reviewed - No data to display  Imaging Review Dg Chest 2 View  09/16/2015  CLINICAL DATA:  Shortness of breath. EXAM: CHEST  2 VIEW COMPARISON:  September 02, 2015. FINDINGS: The heart size and mediastinal contours are within normal limits. No pneumothorax or pleural effusion is noted. Multilevel degenerative disc disease is noted in the mid thoracic spine. Biapical scarring is noted as described on prior CT scan. Nodular density in right suprahilar region is noted. IMPRESSION: Stable biapical scarring is noted. Right perihilar scarring is also noted. Nodular density is seen in right suprahilar region which was described on prior CT scan; unenhanced followup CT scan in 3 months is recommended to ensure stability. Electronically Signed   By: Lupita Raider, M.D.   On: 09/16/2015 14:35   X-rays reviewed and report per radiologist.   Visual Acuity Review  Right Eye Distance:   Left Eye Distance:   Bilateral Distance:    Right Eye Near:   Left Eye Near:    Bilateral Near:         MDM   1. Right-sided chest wall pain   2. Centrilobular emphysema (HCC)        Linna Hoff, MD 09/16/15 [phone]

## 2015-09-16 NOTE — ED Notes (Signed)
States he was released at  10 am from psych facility , ans had to deal w bank after that, and then started to have pain in chest area. . States pain worse when he is up or when he breathes. Wants another chest x-ray. States he never started his antibiotic or new nerve pill

## 2015-09-29 ENCOUNTER — Encounter: Payer: Self-pay | Admitting: Internal Medicine

## 2015-09-29 ENCOUNTER — Ambulatory Visit (INDEPENDENT_AMBULATORY_CARE_PROVIDER_SITE_OTHER): Payer: Managed Care, Other (non HMO) | Admitting: Internal Medicine

## 2015-09-29 VITALS — BP 114/60 | HR 77 | Ht 70.0 in | Wt 142.3 lb

## 2015-09-29 DIAGNOSIS — M7989 Other specified soft tissue disorders: Secondary | ICD-10-CM

## 2015-09-29 DIAGNOSIS — I872 Venous insufficiency (chronic) (peripheral): Secondary | ICD-10-CM

## 2015-09-29 DIAGNOSIS — F17200 Nicotine dependence, unspecified, uncomplicated: Secondary | ICD-10-CM

## 2015-09-29 DIAGNOSIS — R609 Edema, unspecified: Secondary | ICD-10-CM

## 2015-09-29 DIAGNOSIS — I8390 Asymptomatic varicose veins of unspecified lower extremity: Secondary | ICD-10-CM

## 2015-09-29 DIAGNOSIS — I868 Varicose veins of other specified sites: Secondary | ICD-10-CM

## 2015-09-29 NOTE — Progress Notes (Signed)
OFFICE NOTE  Chief Complaint:  Follow-up leg swelling  Primary Care Physician: Marin Comment, FNP  HPI:  Joshua Mcknight is a pleasant 54 year old male who is coming referred to me for evaluation of coronary disease. He receives been having problems with shortness of breath and chest pressure. A CT scan of the chest demonstrated centrilobular emphysema, without pneumonia. There was age advanced coronary atherosclerosis, especially calcium noted in the left circumflex. He does have a long-standing smoking history as well as a family history of heart disease. He has been reporting chest tightness at rest, mostly in the morning as well as shortness of breath and worsening chest tightness during the day. He underwent a pulmonary function study which was mildly abnormal. In addition he apparently has had a cardiometabolic stress test which did not demonstrate ischemic EKG changes and his VO2 was 89% predicted, which is pretty good. An EKG shows normal sinus rhythm without ischemic changes.  Joshua Mcknight returns today for a follow-up. He reports that he's had significant bilateral leg swelling. Since I last saw him in the office he is unfortunate been hospitalized for psychiatric reasons. He says he is now improved. He seems to be on a stable regimen of risperidone and trazodone as well as sertraline. With regards to his swelling, he's recently started on Lasix with some improvement. He reports discoloration of his legs and hair loss. There is also swelling gets worse during the day that improved some at night. He says that his mother actually had varicose veins and believes that he's inherited them. His doctor also referred him for evaluation of possible cardiac cause of his swelling. Currently denies any chest pain or worsening shortness of breath, orthopnea or PND. Weight is stable if not declined somewhat.  PMHx:  Past Medical History  Diagnosis Date  . Hyperlipidemia   . Thyroid disease   .  Emphysema of lung [facility])     Past Surgical History  Procedure Laterality Date  . Knee surgery Left age 17    FAMHx:  Family History  Problem Relation Age of Onset  . CAD Father     CABG at 83  . Aneurysm Father   . Hyperlipidemia Mother   . Hypertension Mother   . Edema Mother   . Other Brother     artery blockage  . Bronchitis Brother   . Diabetes Mellitus I Brother     SOCHx:   reports that he has been smoking Cigarettes.  He has a 64 pack-year smoking history. He has never used smokeless tobacco. He reports that he does not drink alcohol or use illicit drugs.  ALLERGIES:  Allergies  Allergen Reactions  . Coconut Oil Rash    ROS: A comprehensive review of systems was negative except for: Cardiovascular: positive for lower extremity edema  HOME MEDS: Current Outpatient Prescriptions  Medication Sig Dispense Refill  . flintstones complete (FLINTSTONES) 60 MG chewable tablet Chew 1 tablet by mouth daily.    . furosemide (LASIX) 20 MG tablet Take 20 mg by mouth daily.  0  . levothyroxine (SYNTHROID, LEVOTHROID) 125 MCG tablet Take 125 mcg by mouth daily before breakfast.    . RISPERIDONE PO Take 10 mg by mouth at bedtime.    . sertraline (ZOLOFT) 100 MG tablet Take 100 mg by mouth daily.  0  . traZODone (DESYREL) 50 MG tablet Take 25 mg by mouth at bedtime.  0   No current facility-administered medications for this visit.    LABS/IMAGING: No  results found for this or any previous visit (from the past 48 hour(s)). No results found.  VITALS: BP 114/60 mmHg  Pulse 77  Ht 5\' 10"  (1.778 m)  Wt 142 lb 4.8 oz (64.547 kg)  BMI 20.42 kg/m2  SpO2 98%  EXAM: General appearance: alert and no distress Neck: no carotid bruit and no JVD Lungs: clear to auscultation bilaterally Heart: regular rate and rhythm, S1, S2 normal, no murmur, click, rub or gallop Abdomen: soft, non-tender; bowel sounds normal; no masses,  no organomegaly Extremities: edema 2+ bilateral pitting  edema, varicose veins noted and venous stasis dermatitis noted Pulses: 2+ and symmetric Skin: Skin color, texture, turgor normal. No rashes or lesions Neurologic: Grossly normal Psych: Mood, affect normal  EKG: Deferred  ASSESSMENT: 1. Bilateral lower extremity edema with chronic venous stasis changes - CEAP 1,2,3, 4a disease bilaterally  2. Abnormal CT indicating coronary calcium 3. Family history of coronary disease 4. Long-standing tobacco abuse 5. Early emphysematous changes of the lung 6. Essentially normal cardiometabolic testing with a VO2 of 89%  PLAN: 1.  Joshua Mcknight has bilateral venous stasis edema and varicose veins, which I think is related to venous insufficiency. There is family history of this in his mother. He has had some improvement with diuretics. Symptoms did not seem consistent with heart failure although an echocardiogram may be helpful to rule out RV dysfunction. In addition, I'd recommend bilateral lower extremity arterial ultrasounds as well as venous insufficiency study. I've recommended 20-30 mmHg knee-high bilateral compression stockings. He should continue Lasix. Based on the findings of his ultrasounds, he may be a candidate for endovenous ablation. Plan to see him back to discuss the results of those findings in a few weeks.  Thanks again for referring him back for this evaluation.  Chrystie NoseKenneth C. Hilty, MD, [facility] HospitalFACC Attending Cardiologist [facility]  Chrystie NoseKenneth C Hilty 09/29/2015, 5:11 PM

## 2015-09-29 NOTE — Patient Instructions (Addendum)
Your physician has requested that you have a lower  extremity venous duplex. This test is an ultrasound of the veins in the legs or arms. It looks at venous blood flow that carries blood from the heart to the legs or arms. Allow one hour for a Lower Venous exam. Allow thirty minutes for an Upper Venous exam. There are no restrictions or special instructions.  Your physician has requested that you have a lower  extremity arterial duplex. This test is an ultrasound of the arteries in the legs or arms. It looks at arterial blood flow in the legs and arms. Allow one hour for Lower and Upper Arterial scans. There are no restrictions or special instructions   BOTH OFTHESE STUDIES WILL BE DONE [facility].  Your physician has requested that you have an echocardiogram. Echocardiography is a painless test that uses sound waves to create images of your heart. It provides your doctor with information about the size and shape of your heart and how well your heart's chambers and valves are working. This procedure takes approximately one hour. There are no restrictions for this procedure.    Your physician recommends that you schedule a follow-up appointment in: AFTER STUDIES.  How to Use Compression Stockings Compression stockings are elastic socks that squeeze the legs. They help to increase blood flow to the legs, decrease swelling in the legs, and reduce the chance of developing blood clots in the lower legs. Compression stockings are often used by people who:  Are recovering from surgery.  Have poor circulation in their legs.  Are prone to getting blood clots in their legs.  Have varicose veins.  Sit or stay in bed for long periods of time. HOW TO USE COMPRESSION STOCKINGS Before you put on your compression stockings:  Make sure that they are the correct size. If you do not know your size, ask your health care provider.  Make sure that they are clean, dry, and in good  condition.  Check them for rips and tears. Do not put them on if they are ripped or torn. Put your stockings on first thing in the morning, before you get out of bed. Keep them on for as long as your health care provider advises. When you are wearing your stockings:  Keep them as smooth as possible. Do not allow them to bunch up. It is especially important to prevent the stockings from bunching up around your toes or behind your knees.  Do not roll the stockings downward and leave them rolled down. This can decrease blood flow to your leg.  Change them right away if they become wet or dirty. When you take off your stockings, inspect your legs and feet. Anything that does not seem normal may require medical attention. Look for:  Open sores.  Red spots.  Swelling. INFORMATION AND TIPS  Do not stop wearing your compression stockings without talking to your health care provider first.  Wash your stockings everyday with mild detergent in cold or warm water. Do not use bleach. Air-dry your stockings or dry them in a clothes dryer on low heat.  Replace your stockings every 3-6 months.  If skin moisturizing is part of your treatment plan, apply lotion or cream at night so that your skin will be dry when you put on the stockings in the morning. It is harder to put the stockings on when you have lotion on your legs or feet. SEEK MEDICAL CARE IF: Remove your stockings  and seek medical care if:  You have a feeling of pins and needles in your feet or legs.  You have any new changes in your skin.  You have skin lesions that are getting worse.  You have swelling or pain that is getting worse. SEEK IMMEDIATE MEDICAL CARE IF:  You have numbness or tingling in your lower legs that does not get better immediately after you take the stockings off.  Your toes or feet become cold and blue.  You develop open sores or red spots on your legs that do not go away.  You see or feel a warm spot on  your leg.  You have new swelling or soreness in your leg.  You are short of breath or you have chest pain for no reason.  You have a rapid or irregular heartbeat.  You feel light-headed or dizzy.   This information is not intended to replace advice given to you by your health care provider. Make sure you discuss any questions you have with your health care provider.   Document Released: 08/13/2009 Document Revised: 03/02/2015 Document Reviewed: 09/23/2014 Elsevier Interactive Patient Education [employer]

## 2015-10-05 ENCOUNTER — Telehealth: Payer: Self-pay | Admitting: Internal Medicine

## 2015-10-05 DIAGNOSIS — I739 Peripheral vascular disease, unspecified: Secondary | ICD-10-CM

## 2015-10-05 NOTE — Telephone Encounter (Signed)
Patient is scheduled for lower arterial dopplers and ABI's---the diagnosis needs to be for arterial symptoms not venous insufficiency.

## 2015-10-05 NOTE — Telephone Encounter (Signed)
Ordered

## 2015-10-06 ENCOUNTER — Encounter (HOSPITAL_COMMUNITY): Payer: Self-pay

## 2015-10-07 ENCOUNTER — Ambulatory Visit (HOSPITAL_COMMUNITY)
Admission: RE | Admit: 2015-10-07 | Discharge: 2015-10-07 | Disposition: A | Discharge status: Home / Self Care | Payer: Managed Care, Other (non HMO) | Source: Ambulatory Visit | Attending: Vascular Surgery | Admitting: Vascular Surgery

## 2015-10-07 ENCOUNTER — Inpatient Hospital Stay (HOSPITAL_COMMUNITY)
Admission: RE | Admit: 2015-10-07 | Discharge: 2015-10-07 | Disposition: A | Discharge status: Home / Self Care | Payer: Self-pay | Source: Ambulatory Visit

## 2015-10-07 DIAGNOSIS — I872 Venous insufficiency (chronic) (peripheral): Secondary | ICD-10-CM

## 2015-10-07 DIAGNOSIS — R609 Edema, unspecified: Secondary | ICD-10-CM

## 2015-10-07 NOTE — Telephone Encounter (Signed)
-----   Message from Leotis PainMarilyn K Fogleman sent at 10/07/2015  8:45 AM EST ----- Regarding: FW: Orders for appt on 12/08 FYI  ----- Message -----    From: Gaynelle CageWanda M Waddell, CMA    Sent: 10/06/2015   5:02 PM      To: Leotis PainMarilyn K Fogleman Subject: RE: Orders for appt on 12/08                   Per dr hilty if they can't do the study w/o the ABI'S then cancel the arterial study.  ----- Message -----    From: Leotis PainMarilyn K Fogleman    Sent: 10/06/2015   1:08 PM      To: Gaynelle CageWanda M Waddell, CMA Subject: FW: Orders for appt on 12/08                   Wanda--if you don't mind, could you correct the order for this patient's ABI?  He is scheduled for tomorrow at [facility].  Thanks  ----- Message -----    From: Rudean HaskellMelinda M Reaves    Sent: 10/06/2015  10:04 AM      To: Leotis PainMarilyn K Fogleman Subject: Orders for appt on 12/08                       Good Morning Joyce GrossKay,  [name] is scheduled for Venous Reflux, LE arterials, and ABI's on 12/08.  I saw where the nurse fixed the diagnosis for the LE arterial but the diagnosis for the ABI also needs to be corrected.  It has venous insufficiency for the indication which is incorrect.      Thanks, Goldman SachsMelinda

## 2015-10-11 ENCOUNTER — Emergency Department (HOSPITAL_COMMUNITY)
Admission: EM | Admit: 2015-10-11 | Discharge: 2015-10-11 | Disposition: A | Discharge status: Home / Self Care | Payer: Managed Care, Other (non HMO) | Attending: Emergency Medicine | Admitting: Emergency Medicine

## 2015-10-11 ENCOUNTER — Encounter (HOSPITAL_COMMUNITY): Payer: Self-pay | Admitting: Nurse Practitioner

## 2015-10-11 ENCOUNTER — Emergency Department (HOSPITAL_COMMUNITY): Payer: Managed Care, Other (non HMO)

## 2015-10-11 DIAGNOSIS — R51 Headache: Secondary | ICD-10-CM | POA: Insufficient documentation

## 2015-10-11 DIAGNOSIS — J439 Emphysema, unspecified: Secondary | ICD-10-CM | POA: Diagnosis not present

## 2015-10-11 DIAGNOSIS — F17210 Nicotine dependence, cigarettes, uncomplicated: Secondary | ICD-10-CM | POA: Diagnosis not present

## 2015-10-11 DIAGNOSIS — E079 Disorder of thyroid, unspecified: Secondary | ICD-10-CM | POA: Diagnosis not present

## 2015-10-11 DIAGNOSIS — R110 Nausea: Secondary | ICD-10-CM | POA: Diagnosis not present

## 2015-10-11 DIAGNOSIS — R509 Fever, unspecified: Secondary | ICD-10-CM | POA: Diagnosis present

## 2015-10-11 DIAGNOSIS — R6889 Other general symptoms and signs: Secondary | ICD-10-CM

## 2015-10-11 LAB — COMPREHENSIVE METABOLIC PANEL
ALBUMIN: 3.9 g/dL (ref 3.5–5.0)
ALT: 16 U/L — ABNORMAL LOW (ref 17–63)
ANION GAP: 11 (ref 5–15)
AST: 26 U/L (ref 15–41)
Alkaline Phosphatase: 82 U/L (ref 38–126)
BILIRUBIN TOTAL: 0.8 mg/dL (ref 0.3–1.2)
BUN: 5 mg/dL — ABNORMAL LOW (ref 6–20)
CO2: 23 mmol/L (ref 22–32)
Calcium: 8.8 mg/dL — ABNORMAL LOW (ref 8.9–10.3)
Chloride: 98 mmol/L — ABNORMAL LOW (ref 101–111)
Creatinine, Ser: 0.83 mg/dL (ref 0.61–1.24)
GFR calc Af Amer: >60 mL/min (ref >60)
Glucose, Bld: 104 mg/dL — ABNORMAL HIGH (ref 65–99)
POTASSIUM: 3.5 mmol/L (ref 3.5–5.1)
Sodium: 132 mmol/L — ABNORMAL LOW (ref 135–145)
TOTAL PROTEIN: 7.1 g/dL (ref 6.5–8.1)

## 2015-10-11 LAB — URINE MICROSCOPIC-ADD ON

## 2015-10-11 LAB — I-STAT CG4 LACTIC ACID, ED
LACTIC ACID, VENOUS: 0.91 mmol/L (ref 0.5–2.0)
LACTIC ACID, VENOUS: 1.15 mmol/L (ref 0.5–2.0)

## 2015-10-11 LAB — URINALYSIS, ROUTINE W REFLEX MICROSCOPIC
Bilirubin Urine: NEGATIVE
Glucose, UA: NEGATIVE mg/dL
Ketones, ur: NEGATIVE mg/dL
LEUKOCYTES UA: NEGATIVE
Nitrite: NEGATIVE
PROTEIN: NEGATIVE mg/dL
Specific Gravity, Urine: 1.011 (ref 1.005–1.030)
pH: 6 (ref 5.0–8.0)

## 2015-10-11 LAB — CBC
HCT: 39.1 % (ref 39.0–52.0)
HEMOGLOBIN: 13.1 g/dL (ref 13.0–17.0)
MCH: 28.7 pg (ref 26.0–34.0)
MCHC: 33.5 g/dL (ref 30.0–36.0)
MCV: 85.6 fL (ref 78.0–100.0)
Platelets: 199 10*3/uL (ref 150–400)
RBC: 4.57 MIL/uL (ref 4.22–5.81)
RDW: 13.3 % (ref 11.5–15.5)
WBC: 9.5 10*3/uL (ref 4.0–10.5)

## 2015-10-11 LAB — RAPID STREP SCREEN (MED CTR MEBANE ONLY): STREPTOCOCCUS, GROUP A SCREEN (DIRECT): NEGATIVE

## 2015-10-11 MED ORDER — KETOROLAC TROMETHAMINE 30 MG/ML IJ SOLN
30.0000 mg | Freq: Once | INTRAMUSCULAR | Status: AC
  Administered 2015-10-11: 30 mg via INTRAVENOUS
  Filled 2015-10-11: qty 1

## 2015-10-11 MED ORDER — ACETAMINOPHEN 325 MG PO TABS
650.0000 mg | ORAL_TABLET | Freq: Once | ORAL | Status: AC | PRN
  Administered 2015-10-11: 650 mg via ORAL

## 2015-10-11 MED ORDER — ONDANSETRON 8 MG PO TBDP: 8.0000 mg | ORAL_TABLET | Freq: Three times a day (TID) | ORAL | Status: DC | PRN

## 2015-10-11 MED ORDER — GUAIFENESIN-DM 100-10 MG/5ML PO SYRP: 5.0000 mL | ORAL_SOLUTION | ORAL | Status: DC | PRN

## 2015-10-11 MED ORDER — SODIUM CHLORIDE 0.9 % IV BOLUS (SEPSIS)
1000.0000 mL | Freq: Once | INTRAVENOUS | Status: AC
  Administered 2015-10-11: 1000 mL via INTRAVENOUS

## 2015-10-11 MED ORDER — ALBUTEROL SULFATE HFA 108 (90 BASE) MCG/ACT IN AERS
2.0000 | INHALATION_SPRAY | Freq: Once | RESPIRATORY_TRACT | Status: AC
  Administered 2015-10-11: 2 via RESPIRATORY_TRACT
  Filled 2015-10-11: qty 6.7

## 2015-10-11 MED ORDER — ACETAMINOPHEN 325 MG PO TABS
ORAL_TABLET | ORAL | Status: AC
  Administered 2015-10-11: 650 mg via ORAL
  Filled 2015-10-11: qty 2

## 2015-10-11 MED ORDER — HYDROCOD POLST-CPM POLST ER 10-8 MG/5ML PO SUER
5.0000 mL | Freq: Once | ORAL | Status: AC
Start: 2015-10-11 — End: 2015-10-11
  Administered 2015-10-11: 5 mL via ORAL
  Filled 2015-10-11: qty 5

## 2015-10-11 MED ORDER — ACETAMINOPHEN 325 MG PO TABS: 650.0000 mg | ORAL_TABLET | Freq: Four times a day (QID) | ORAL | Status: DC | PRN

## 2015-10-11 NOTE — ED Notes (Addendum)
Pt has had fevers, cough increasingly worse over past week. He went to work today and coworkers called the family to come get him, stating "he was so weak he could hardly stand." pt states he does feel weak and has had some CP and SOB also. Pt took aspirin today for fever pta. A&Ox4, rhonchorous cough.

## 2015-10-11 NOTE — Discharge Instructions (Signed)
Take tylenol and motrin for body aches and fever every 6 hrs. Rest. Fluids. Zofran for nausea as needed. Robitussin for cough. Inhaler 2 puffs every 4 hrs as needed. Follow up with primary care doctor. Return if worsening symptoms.   Influenza, Adult Influenza ("the flu") is a viral infection of the respiratory tract. It occurs more often in winter months because people spend more time in close contact with one another. Influenza can make you feel very sick. Influenza easily spreads from person to person (contagious). CAUSES  Influenza is caused by a virus that infects the respiratory tract. You can catch the virus by breathing in droplets from an infected person's cough or sneeze. You can also catch the virus by touching something that was recently contaminated with the virus and then touching your mouth, nose, or eyes. RISKS AND COMPLICATIONS You may be at risk for a more severe case of influenza if you smoke cigarettes, have diabetes, have chronic heart disease (such as heart failure) or lung disease (such as asthma), or if you have a weakened immune system. Elderly people and pregnant women are also at risk for more serious infections. The most common problem of influenza is a lung infection (pneumonia). Sometimes, this problem can require emergency medical care and may be life threatening. SIGNS AND SYMPTOMS  Symptoms typically last 4 to 10 days and may include:  Fever.  Chills.  Headache, body aches, and muscle aches.  Sore throat.  Chest discomfort and cough.  Poor appetite.  Weakness or feeling tired.  Dizziness.  Nausea or vomiting. DIAGNOSIS  Diagnosis of influenza is often made based on your history and a physical exam. A nose or throat swab test can be done to confirm the diagnosis. TREATMENT  In mild cases, influenza goes away on its own. Treatment is directed at relieving symptoms. For more severe cases, your health care provider may prescribe antiviral medicines to  shorten the sickness. Antibiotic medicines are not effective because the infection is caused by a virus, not by bacteria. HOME CARE INSTRUCTIONS  Take medicines only as directed by your health care provider.  Use a cool mist humidifier to make breathing easier.  Get plenty of rest until your temperature returns to normal. This usually takes 3 to 4 days.  Drink enough fluid to keep your urine clear or pale yellow.  Cover yourmouth and nosewhen coughing or sneezing,and wash your handswellto prevent thevirusfrom spreading.  Stay homefromwork orschool untilthe fever is gonefor at least 43full day. PREVENTION  An annual influenza vaccination (flu shot) is the best way to avoid getting influenza. An annual flu shot is now routinely recommended for all adults in the [place] SEEK MEDICAL CARE IF:  You experiencechest pain, yourcough worsens,or you producemore mucus.  Youhave nausea,vomiting, ordiarrhea.  Your fever returns or gets worse. SEEK IMMEDIATE MEDICAL CARE IF:  You havetrouble breathing, you become short of breath,or your skin ornails becomebluish.   You have severe painor stiffnessin the neck.  You develop a sudden headache, or pain in the face or ear.  You have nausea or vomiting that you cannot control. MAKE SURE YOU:   Understand these instructions.  Will watch your condition.  Will get help right away if you are not doing well or get worse.   This information is not intended to replace advice given to you by your health care provider. Make sure you discuss any questions you have with your health care provider.   Document Released: 10/13/2000 Document Revised: 11/06/2014 Document Reviewed:  01/15/2012 Elsevier Interactive Patient Education 2016 Elsevier Inc.    Fever, Adult A fever is an increase in the body's temperature. It is usually defined as a temperature of 100F (38C) or higher. Brief mild or moderate fevers generally have no  long-term effects, and they often do not require treatment. Moderate or high fevers may make you feel uncomfortable and can sometimes be a sign of a serious illness or disease. The sweating that may occur with repeated or prolonged fever may also cause dehydration. Fever is confirmed by taking a temperature with a thermometer. A measured temperature can vary with:  Age.  Time of day.  Location of the thermometer:  Mouth (oral).  Rectum (rectal).  Ear (tympanic).  Underarm (axillary).  Forehead (temporal). HOME CARE INSTRUCTIONS Pay attention to any changes in your symptoms. Take these actions to help with your condition:  Take over-the counter and prescription medicines only as told by your health care provider. Follow the dosing instructions carefully.  If you were prescribed an antibiotic medicine, take it as told by your health care provider. Do not stop taking the antibiotic even if you start to feel better.  Rest as needed.  Drink enough fluid to keep your urine clear or pale yellow. This helps to prevent dehydration.  Sponge yourself or bathe with room-temperature water to help reduce your body temperature as needed. Do not use ice water.  Do not overbundle yourself in blankets or heavy clothes. SEEK MEDICAL CARE IF:  You vomit.  You cannot eat or drink without vomiting.  You have diarrhea.  You have pain when you urinate.  Your symptoms do not improve with treatment.  You develop new symptoms.  You develop excessive weakness. SEEK IMMEDIATE MEDICAL CARE IF:  You have shortness of breath or have trouble breathing.  You are dizzy or you faint.  You are disoriented or confused.  You develop signs of dehydration, such as a dry mouth, decreased urination, or paleness.  You develop severe pain in your abdomen.  You have persistent vomiting or diarrhea.  You develop a skin rash.  Your symptoms suddenly get worse.   This information is not intended to  replace advice given to you by your health care provider. Make sure you discuss any questions you have with your health care provider.   Document Released: 04/11/2001 Document Revised: 07/07/2015 Document Reviewed: 12/10/2014 Elsevier Interactive Patient Education [employer] Inc[employer]

## 2015-10-11 NOTE — ED Provider Notes (Signed)
5:39 PM Patient ambulatory.  No distress.  Explicit return precautions, care instructions and findings discussed with the patient and his wife.   Gerhard Munchobert Aylinn Rydberg, MD 10/11/15 1739

## 2015-10-11 NOTE — ED Provider Notes (Signed)
CSN: [id number]     Arrival date & time 10/11/15  1046 History   First MD Initiated Contact with Patient 10/11/15 1428     Chief Complaint  Patient presents with  . Fever     (Consider location/radiation/quality/duration/timing/severity/associated sxs/prior Treatment) HPI Joshua Mcknight is a 54 y.o. male with hx of thyroid disease, COPD, presents to emergency department with flulike symptoms. Patient states 2 days ago he developed sore throat, nasal congestion, cough, body aches, fever. He states this morning he went to work and was feeling worse, was too weak to stand up, so family brought patient to emergency department. Patient took ibuprofen 800 mg 5 AM this morning. No other medications taken for his symptoms. Patient reports headache and body aches. He denies any neck pain or stiffness. Denies any urinary symptoms. He admits to nausea, denies any vomiting or diarrhea. Denies any abdominal pain. He states his cough is nonproductive. Patient denies getting flu shot that she her.  Past Medical History  Diagnosis Date  . Hyperlipidemia   . Thyroid disease   . Emphysema of lung [facility](HCC)    Past Surgical History  Procedure Laterality Date  . Knee surgery Left age 54   Family History  Problem Relation Age of Onset  . CAD Father     CABG at 3545  . Aneurysm Father   . Hyperlipidemia Mother   . Hypertension Mother   . Edema Mother   . Other Brother     artery blockage  . Bronchitis Brother   . Diabetes Mellitus I Brother    Social History  Substance Use Topics  . Smoking status: Current Every Day Smoker -- 2.00 packs/day for 32 years    Types: Cigarettes  . Smokeless tobacco: Never Used     Comment: 1 ppd (01/28/2014)  . Alcohol Use: No    Review of Systems  Constitutional: Positive for fever and chills.  HENT: Positive for congestion, rhinorrhea and sore throat. Negative for trouble swallowing and voice change.   Eyes: Negative for photophobia.  Respiratory: Positive for cough.  Negative for chest tightness and shortness of breath.   Cardiovascular: Negative for chest pain, palpitations and leg swelling.  Gastrointestinal: Positive for nausea. Negative for vomiting, abdominal pain, diarrhea and abdominal distention.  Genitourinary: Negative for dysuria, urgency, frequency and hematuria.  Musculoskeletal: Positive for myalgias. Negative for neck pain and neck stiffness.  Skin: Negative for rash.  Allergic/Immunologic: Negative for immunocompromised state.  Neurological: Positive for headaches. Negative for dizziness, weakness, light-headedness and numbness.  All other systems reviewed and are negative.     Allergies  Coconut oil  Home Medications   Prior to Admission medications   Medication Sig Start Date End Date Taking? Authorizing Provider  flintstones complete (FLINTSTONES) 60 MG chewable tablet Chew 1 tablet by mouth daily.    Historical Provider, MD  furosemide (LASIX) 20 MG tablet Take 20 mg by mouth daily. 09/20/15   Historical Provider, MD  levothyroxine (SYNTHROID, LEVOTHROID) 125 MCG tablet Take 125 mcg by mouth daily before breakfast.    Historical Provider, MD  RISPERIDONE PO Take 10 mg by mouth at bedtime.    Historical Provider, MD  sertraline (ZOLOFT) 100 MG tablet Take 100 mg by mouth daily. 09/17/15   Historical Provider, MD  traZODone (DESYREL) 50 MG tablet Take 25 mg by mouth at bedtime. 09/17/15   Historical Provider, MD   BP 124/70 mmHg  Pulse 98  Temp(Src) 102.2 F (39 C) (Oral)  Resp 18  SpO2  98% Physical Exam  Constitutional: He is oriented to person, place, and time. He appears well-developed and well-nourished. No distress.  HENT:  Head: Normocephalic and atraumatic.  Right Ear: Tympanic membrane, external ear and ear canal normal.  Left Ear: Tympanic membrane, external ear and ear canal normal.  Nose: Mucosal edema and rhinorrhea present.  Mouth/Throat: Uvula is midline and mucous membranes are normal. Posterior  oropharyngeal erythema present. No oropharyngeal exudate or posterior oropharyngeal edema.  Eyes: Conjunctivae are normal.  Neck: Neck supple.  Cardiovascular: Normal rate, regular rhythm and normal heart sounds.   Pulmonary/Chest: Effort normal. No respiratory distress. He has no wheezes. He has no rales.  Abdominal: Soft. Bowel sounds are normal. He exhibits no distension. There is no tenderness. There is no rebound.  Musculoskeletal: He exhibits no edema.  Neurological: He is alert and oriented to person, place, and time.  Skin: Skin is warm and dry.  Nursing note and vitals reviewed.   ED Course  Procedures (including critical care time) Labs Review Labs Reviewed  COMPREHENSIVE METABOLIC PANEL - Abnormal; Notable for the following:    Sodium 132 (*)    Chloride 98 (*)    Glucose, Bld 104 (*)    BUN 5 (*)    Calcium 8.8 (*)    ALT 16 (*)    All other components within normal limits  CULTURE, BLOOD (ROUTINE X 2)  CULTURE, BLOOD (ROUTINE X 2)  URINE CULTURE  RAPID STREP SCREEN (NOT AT [facility])  CBC  URINALYSIS, ROUTINE W REFLEX MICROSCOPIC (NOT AT [facility])  I-STAT CG4 LACTIC ACID, ED  I-STAT CG4 LACTIC ACID, ED    Imaging Review Dg Chest 2 View  10/11/2015  CLINICAL DATA:  Fever and cough, worsening over the past week. Weakness. EXAM: CHEST  2 VIEW COMPARISON:  09/16/2015 and CT 09/02/2015 FINDINGS: Again noted is scarring in the apical regions bilaterally with underlying emphysema. There is no new airspace disease or consolidation. Heart and mediastinum are within normal limits. The trachea is midline. Negative for a pneumothorax. No acute bone abnormality. Negative for pleural effusions. IMPRESSION: Stable chronic changes without acute chest findings. Electronically Signed   By: Richarda Overlie M.D.   On: 10/11/2015 12:18   I have personally reviewed and evaluated these images and lab results as part of my medical decision-making.   EKG Interpretation None      MDM   Final  diagnoses:  Flu-like symptoms  Fever, unspecified fever cause   Patient emergency department with flulike symptoms. Temperature upon presentation is one of 3.3. He received Tylenol at triage. His blood pressure and heart rate are normal. He is not tachypneic. He does not meet sepsis criteria at this time. Patient labs were obtained by triage as well. No elevation in white blood cell count, lactic acid is normal as well. Urinalysis pending. Strep ordered. Will give IV fluids, Toradol and Tussionex for symptoms. Will monitor. Question influenza.    3:50 PM Temp down to 100.3. VS remain normal. Pt receiving IV fluids. UA and strep negative. Plan to dc home with symptomatic tx. Return precautions discussed. Discussed with Dr. Jodi Mourning, agrees with plan.    Filed Vitals:   10/11/15 1615 10/11/15 1630 10/11/15 1715 10/11/15 1721  BP:   Pulse: 80 84 82 80  Temp:    100.3 F (37.9 C)  TempSrc:    Oral  Resp:      SpO2: 97% 96% 96% 97%     Jaynie Crumble, PA-C 10/12/15  1557  Blane Ohara, MD 10/17/15 [phone]

## 2015-10-12 ENCOUNTER — Telehealth (HOSPITAL_COMMUNITY): Payer: Self-pay

## 2015-10-12 ENCOUNTER — Institutional Professional Consult (permissible substitution): Payer: Self-pay | Admitting: Internal Medicine

## 2015-10-12 LAB — URINE CULTURE: Culture: NO GROWTH

## 2015-10-12 NOTE — Telephone Encounter (Signed)
Call form lab (+) prelim bld cx aerobic bottle  (+) gram positive cocci in chains.  Chart to MD for review.

## 2015-10-12 NOTE — Telephone Encounter (Signed)
Dr Madilyn Hookees reviewed chart. States pt needs to return for further evaluation.  Attempting to contact pt at this time.

## 2015-10-13 ENCOUNTER — Emergency Department (HOSPITAL_COMMUNITY)
Admission: EM | Admit: 2015-10-13 | Discharge: 2015-10-13 | Disposition: A | Discharge status: Home / Self Care | Payer: Managed Care, Other (non HMO) | Attending: Emergency Medicine | Admitting: Emergency Medicine

## 2015-10-13 ENCOUNTER — Telehealth (HOSPITAL_COMMUNITY): Payer: Self-pay | Admitting: *Deleted

## 2015-10-13 ENCOUNTER — Encounter (HOSPITAL_COMMUNITY): Payer: Self-pay | Admitting: Emergency Medicine

## 2015-10-13 ENCOUNTER — Emergency Department (HOSPITAL_COMMUNITY): Payer: Managed Care, Other (non HMO)

## 2015-10-13 DIAGNOSIS — Z79899 Other long term (current) drug therapy: Secondary | ICD-10-CM | POA: Diagnosis not present

## 2015-10-13 DIAGNOSIS — E079 Disorder of thyroid, unspecified: Secondary | ICD-10-CM | POA: Diagnosis not present

## 2015-10-13 DIAGNOSIS — R509 Fever, unspecified: Secondary | ICD-10-CM | POA: Diagnosis not present

## 2015-10-13 DIAGNOSIS — R7989 Other specified abnormal findings of blood chemistry: Secondary | ICD-10-CM | POA: Diagnosis present

## 2015-10-13 DIAGNOSIS — R7881 Bacteremia: Secondary | ICD-10-CM | POA: Insufficient documentation

## 2015-10-13 DIAGNOSIS — J439 Emphysema, unspecified: Secondary | ICD-10-CM | POA: Diagnosis not present

## 2015-10-13 LAB — URINALYSIS, ROUTINE W REFLEX MICROSCOPIC
BILIRUBIN URINE: NEGATIVE
GLUCOSE, UA: NEGATIVE mg/dL
KETONES UR: 15 mg/dL — AB
LEUKOCYTES UA: NEGATIVE
Nitrite: NEGATIVE
PH: 6.5 (ref 5.0–8.0)
PROTEIN: NEGATIVE mg/dL
Specific Gravity, Urine: 1.006 (ref 1.005–1.030)

## 2015-10-13 LAB — COMPREHENSIVE METABOLIC PANEL
ALBUMIN: 3.4 g/dL — AB (ref 3.5–5.0)
ALT: 18 U/L (ref 17–63)
AST: 31 U/L (ref 15–41)
Alkaline Phosphatase: 73 U/L (ref 38–126)
Anion gap: 7 (ref 5–15)
BILIRUBIN TOTAL: 0.9 mg/dL (ref 0.3–1.2)
CHLORIDE: 105 mmol/L (ref 101–111)
CO2: 26 mmol/L (ref 22–32)
Calcium: 8.6 mg/dL — ABNORMAL LOW (ref 8.9–10.3)
Creatinine, Ser: 0.65 mg/dL (ref 0.61–1.24)
GFR calc Af Amer: >60 mL/min (ref >60)
GFR calc non Af Amer: >60 mL/min (ref >60)
GLUCOSE: 93 mg/dL (ref 65–99)
POTASSIUM: 3.4 mmol/L — AB (ref 3.5–5.1)
Sodium: 138 mmol/L (ref 135–145)
TOTAL PROTEIN: 6.4 g/dL — AB (ref 6.5–8.1)

## 2015-10-13 LAB — CBC WITH DIFFERENTIAL/PLATELET
BASOS ABS: 0 10*3/uL (ref 0.0–0.1)
BASOS PCT: 0 %
EOS PCT: 0 %
Eosinophils Absolute: 0 10*3/uL (ref 0.0–0.7)
HEMATOCRIT: 36.4 % — AB (ref 39.0–52.0)
HEMOGLOBIN: 12.4 g/dL — AB (ref 13.0–17.0)
LYMPHS ABS: 1.9 10*3/uL (ref 0.7–4.0)
LYMPHS PCT: 27 %
MCH: 29 pg (ref 26.0–34.0)
MCHC: 34.1 g/dL (ref 30.0–36.0)
MCV: 85.2 fL (ref 78.0–100.0)
MONO ABS: 0.6 10*3/uL (ref 0.1–1.0)
Monocytes Relative: 9 %
Neutro Abs: 4.6 10*3/uL (ref 1.7–7.7)
Neutrophils Relative %: 64 %
Platelets: 159 10*3/uL (ref 150–400)
RBC: 4.27 MIL/uL (ref 4.22–5.81)
RDW: 13.7 % (ref 11.5–15.5)
WBC: 7.1 10*3/uL (ref 4.0–10.5)

## 2015-10-13 LAB — CULTURE, GROUP A STREP: STREP A CULTURE: NEGATIVE

## 2015-10-13 LAB — I-STAT CG4 LACTIC ACID, ED: Lactic Acid, Venous: 0.67 mmol/L (ref 0.5–2.0)

## 2015-10-13 LAB — URINE MICROSCOPIC-ADD ON: WBC UA: NONE SEEN WBC/hpf (ref 0–5)

## 2015-10-13 NOTE — ED Notes (Signed)
Pt arrives after being called back for positive blood cultures for aerobic gram + cocci. No fevers since yesterday.

## 2015-10-13 NOTE — Discharge Instructions (Signed)
You had one positive blood culture two days ago.  It is suspected that this was contaminated and that you do not have bacteria in your blood stream.  It is important that you follow up with your family doctor for repeat evaluation.  Get rechecked immediately if you develop recurrent fevers or new concerning symptoms.   Fever, Adult A fever is an increase in the body's temperature. It is usually defined as a temperature of 100F (38C) or higher. Brief mild or moderate fevers generally have no long-term effects, and they often do not require treatment. Moderate or high fevers may make you feel uncomfortable and can sometimes be a sign of a serious illness or disease. The sweating that may occur with repeated or prolonged fever may also cause dehydration. Fever is confirmed by taking a temperature with a thermometer. A measured temperature can vary with:  Age.  Time of day.  Location of the thermometer:  Mouth (oral).  Rectum (rectal).  Ear (tympanic).  Underarm (axillary).  Forehead (temporal). HOME CARE INSTRUCTIONS Pay attention to any changes in your symptoms. Take these actions to help with your condition:  Take over-the counter and prescription medicines only as told by your health care provider. Follow the dosing instructions carefully.  If you were prescribed an antibiotic medicine, take it as told by your health care provider. Do not stop taking the antibiotic even if you start to feel better.  Rest as needed.  Drink enough fluid to keep your urine clear or pale yellow. This helps to prevent dehydration.  Sponge yourself or bathe with room-temperature water to help reduce your body temperature as needed. Do not use ice water.  Do not overbundle yourself in blankets or heavy clothes. SEEK MEDICAL CARE IF:  You vomit.  You cannot eat or drink without vomiting.  You have diarrhea.  You have pain when you urinate.  Your symptoms do not improve with treatment.  You  develop new symptoms.  You develop excessive weakness. SEEK IMMEDIATE MEDICAL CARE IF:  You have shortness of breath or have trouble breathing.  You are dizzy or you faint.  You are disoriented or confused.  You develop signs of dehydration, such as a dry mouth, decreased urination, or paleness.  You develop severe pain in your abdomen.  You have persistent vomiting or diarrhea.  You develop a skin rash.  Your symptoms suddenly get worse.   This information is not intended to replace advice given to you by your health care provider. Make sure you discuss any questions you have with your health care provider.   Document Released: 04/11/2001 Document Revised: 07/07/2015 Document Reviewed: 12/10/2014 Elsevier Interactive Patient Education 2016 [employer]  Blood Culture Test WHY AM I HAVING THIS TEST? A blood culture test is performed to see if you have an infection in your blood (septicemia). Septicemia could be caused by bacteria, fungi, or viruses. Normally, blood is free of bacteria, fungi, and viruses. This test may be ordered if you have symptoms of septicemia. These symptoms may include fever, chills, nausea, and fatigue. WHAT KIND OF SAMPLE IS TAKEN? At least two blood samples from two different veins are required for this test. The blood samples are usually collected by inserting a needle into a vein. This is done because:  There is a better chance of finding the infection with multiple samples.  Sometimes, despite disinfection of the skin where the blood is collected, you can grow a skin contaminant. This will result in a positive blood  culture. This is called a false-positive. With multiple samples, there is a better chance of ruling out a false-positive. HOW DO I PREPARE FOR THE TEST? It is preferred to have the blood samples performed before starting antibiotic medicine. Tell your health care provider if you are currently taking an antibiotic. If blood cultures are  performed while you are on an antibiotic, the blood samples should be performed shortly before you take a dose of antibiotic. HOW ARE YOUR TEST RESULTS REPORTED? Your test results will be reported as either positive or negative. It is your responsibility to obtain your test results. Ask the lab or department performing the test when and how you will get your results. A false-positive result can occur. A false-positive result is incorrect because it indicates a condition or finding is present when it is not. A false-negative result can occur. A false-negative result is incorrect because it indicates a condition or finding is not present when it is. WHAT DO THE RESULTS MEAN? A positive blood test may mean that you have septicemia. Talk with your health care provider to discuss your results, treatment options, and if necessary, the need for more tests. Talk with your health care provider if you have any questions about your results.   This information is not intended to replace advice given to you by your health care provider. Make sure you discuss any questions you have with your health care provider.   Document Released: 11/08/2004 Document Revised: 11/06/2014 Document Reviewed: 03/23/2014 Elsevier Interactive Patient Education [employer]

## 2015-10-13 NOTE — ED Provider Notes (Signed)
CSN: [id number]     Arrival date & time 10/13/15  0715 History   First MD Initiated Contact with Patient 10/13/15 [phone]     Chief Complaint  Patient presents with  . Abnormal Lab     The history is provided by the patient. No language interpreter was used.   Joshua Mcknight is a 54 y.o. male who presents to the Emergency Department complaining of callback for positive blood culture. He was seen in the emergency department 2 days ago for fevers, confusion, body aches. He reports mild cough and increased shortness of breath for the last several days. He had a fever to 103 2 days ago. His last fever was yesterday. Currently he has no complaints. He has some mild central chest discomfort described as tightness that is been ongoing for years. He has a mild cough. He denies any vomiting, abdominal pain, diarrhea, dysuria, skin rash. His wife is sick with upper respiratory symptoms. Symptoms were moderate, now resolved.   Past Medical History  Diagnosis Date  . Hyperlipidemia   . Thyroid disease   . Emphysema of lung [facility])    Past Surgical History  Procedure Laterality Date  . Knee surgery Left age 51   Family History  Problem Relation Age of Onset  . CAD Father     CABG at 55  . Aneurysm Father   . Hyperlipidemia Mother   . Hypertension Mother   . Edema Mother   . Other Brother     artery blockage  . Bronchitis Brother   . Diabetes Mellitus I Brother    Social History  Substance Use Topics  . Smoking status: Current Every Day Smoker -- 2.00 packs/day for 32 years    Types: Cigarettes  . Smokeless tobacco: Never Used     Comment: 1 ppd (01/28/2014)  . Alcohol Use: No    Review of Systems  All other systems reviewed and are negative.     Allergies  Coconut oil  Home Medications   Prior to Admission medications   Medication Sig Start Date End Date Taking? Authorizing Provider  acetaminophen (TYLENOL) 325 MG tablet Take 2 tablets (650 mg total) by mouth every 6 (six) hours  as needed for fever or headache. 10/11/15   Jaynie Crumble, PA-C  flintstones complete (FLINTSTONES) 60 MG chewable tablet Chew 1 tablet by mouth daily.    Historical Provider, MD  guaiFENesin-dextromethorphan (ROBITUSSIN DM) 100-10 MG/5ML syrup Take 5 mLs by mouth every 4 (four) hours as needed for cough. 10/11/15   Tatyana Kirichenko, PA-C  ibuprofen (ADVIL,MOTRIN) 200 MG tablet Take 200 mg by mouth every 6 (six) hours as needed for moderate pain.    Historical Provider, MD  levothyroxine (SYNTHROID, LEVOTHROID) 125 MCG tablet Take 125 mcg by mouth daily before breakfast.    Historical Provider, MD  ondansetron (ZOFRAN ODT) 8 MG disintegrating tablet Take 1 tablet (8 mg total) by mouth every 8 (eight) hours as needed for nausea or vomiting. 10/11/15   Tatyana Kirichenko, PA-C   BP 95/69 mmHg  Pulse 78  Temp(Src) 97.5 F (36.4 C) (Oral)  Resp 18  Ht  (1.778 m)  Wt 130 lb (58.968 kg)  BMI 18.65 kg/m2  SpO2 100% Physical Exam  Constitutional: He is oriented to person, place, and time. He appears well-developed and well-nourished.  HENT:  Head: Normocephalic and atraumatic.  Cardiovascular: Normal rate and regular rhythm.   No murmur heard. Pulmonary/Chest: Effort normal and breath sounds normal. No respiratory distress.  Abdominal:  Soft. There is no tenderness. There is no rebound and no guarding.  Musculoskeletal: He exhibits no edema or tenderness.  Neurological: He is alert and oriented to person, place, and time.  Skin: Skin is warm and dry.  Psychiatric: He has a normal mood and affect. His behavior is normal.  Nursing note and vitals reviewed.   ED Course  Procedures (including critical care time) Labs Review Labs Reviewed  URINALYSIS, ROUTINE W REFLEX MICROSCOPIC (NOT AT [facility]) - Abnormal; Notable for the following:    Hgb urine dipstick TRACE (*)    Ketones, ur 15 (*)    All other components within normal limits  COMPREHENSIVE METABOLIC PANEL - Abnormal; Notable  for the following:    Potassium 3.4 (*)    BUN <5 (*)    Calcium 8.6 (*)    Total Protein 6.4 (*)    Albumin 3.4 (*)    All other components within normal limits  CBC WITH DIFFERENTIAL/PLATELET - Abnormal; Notable for the following:    Hemoglobin 12.4 (*)    HCT 36.4 (*)    All other components within normal limits  URINE MICROSCOPIC-ADD ON - Abnormal; Notable for the following:    Squamous Epithelial / LPF 0-5 (*)    Bacteria, UA RARE (*)    All other components within normal limits  URINE CULTURE  CULTURE, BLOOD (ROUTINE X 2)  CULTURE, BLOOD (ROUTINE X 2)  I-STAT CG4 LACTIC ACID, ED    Imaging Review Dg Chest 2 View  10/11/2015  CLINICAL DATA:  Fever and cough, worsening over the past week. Weakness. EXAM: CHEST  2 VIEW COMPARISON:  09/16/2015 and CT 09/02/2015 FINDINGS: Again noted is scarring in the apical regions bilaterally with underlying emphysema. There is no new airspace disease or consolidation. Heart and mediastinum are within normal limits. The trachea is midline. Negative for a pneumothorax. No acute bone abnormality. Negative for pleural effusions. IMPRESSION: Stable chronic changes without acute chest findings. Electronically Signed   By: Richarda OverlieAdam  Henn M.D.   On: 10/11/2015 12:18   I have personally reviewed and evaluated these images and lab results as part of my medical decision-making.   EKG Interpretation None      MDM   Final diagnoses:  Febrile illness  Blood culture positive for Candida species    Patient here for evaluation abnormal lab, positive blood culture 1. He has no systemic symptoms currently and is doing well. There is no evidence of acute infectious process at this time. He has no history of IV drug abuse. No recent instrumentation. Presentation is not consistent with sepsis, endocarditis, acute serious bacterial illness. Discussed with patient the findings of positive blood culture, likely a contaminate. Discussed outpatient follow-up as well  as very close return precautions for development of new or change in symptoms.    Tilden FossaElizabeth Miia Blanks, MD 10/13/15 [phone]

## 2015-10-14 ENCOUNTER — Ambulatory Visit (HOSPITAL_COMMUNITY): Payer: Managed Care, Other (non HMO)

## 2015-10-14 DIAGNOSIS — R0989 Other specified symptoms and signs involving the circulatory and respiratory systems: Secondary | ICD-10-CM

## 2015-10-14 LAB — URINE CULTURE

## 2015-10-15 LAB — CULTURE, BLOOD (ROUTINE X 2)

## 2015-10-16 LAB — CULTURE, BLOOD (ROUTINE X 2): Culture: NO GROWTH

## 2015-10-18 ENCOUNTER — Telehealth: Payer: Self-pay | Admitting: *Deleted

## 2015-10-18 ENCOUNTER — Encounter (HOSPITAL_COMMUNITY): Payer: Self-pay

## 2015-10-18 LAB — CULTURE, BLOOD (ROUTINE X 2)
Culture: NO GROWTH
Culture: NO GROWTH

## 2015-10-18 NOTE — Telephone Encounter (Signed)
Thanks .Marland Kitchen. Studies show varicose veins. Please refer to Dr. Arbie CookeyEarly or colleagues at [facility] for evaluation as he is symptomatic.        Dr. Rexene EdisonH    .Left message for pt to call

## 2015-10-20 ENCOUNTER — Ambulatory Visit (INDEPENDENT_AMBULATORY_CARE_PROVIDER_SITE_OTHER): Payer: Managed Care, Other (non HMO) | Admitting: Internal Medicine

## 2015-10-20 ENCOUNTER — Encounter: Payer: Self-pay | Admitting: Internal Medicine

## 2015-10-20 VITALS — BP 114/68 | HR 78 | Ht 70.0 in | Wt 132.0 lb

## 2015-10-20 DIAGNOSIS — J449 Chronic obstructive pulmonary disease, unspecified: Secondary | ICD-10-CM

## 2015-10-20 DIAGNOSIS — F17210 Nicotine dependence, cigarettes, uncomplicated: Secondary | ICD-10-CM

## 2015-10-20 DIAGNOSIS — K089 Disorder of teeth and supporting structures, unspecified: Secondary | ICD-10-CM | POA: Diagnosis not present

## 2015-10-20 DIAGNOSIS — Z720 Tobacco use: Secondary | ICD-10-CM

## 2015-10-20 NOTE — Patient Instructions (Signed)
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

## 2015-10-20 NOTE — Progress Notes (Signed)
   Subjective:    Patient ID: Joshua Mcknight, male    DOB: [birth year]     MRN: [id number]  HPI  [age] yowm active smoker referred to pulmonary clinic 10/20/2015  by Dr Elnita Maxwellheryl wells for copd eval with nl spirometry prev obtained in 3/ 2015  For cpst which showed nl fev1   10/20/2015 1st [facility] office visit/ Beckie Viscardi   Chief Complaint  Patient presents with  . Pulmonary Consult    Referred by Dr. Marin Commentheryl Wells. Pt states he was dxed with Emphysema approx 2 yrs ago. He denies any SOB but states "If I over exert myself I get dizzy".    indolent onset doe x sev years [facility] = can walk nl pace, flat grade, can't hurry or go uphills or steps s sob   Inhalers don't help   No obvious other patterns in day to day or daytime variabilty or assoc chronic cough or cp or chest tightness, subjective wheeze overt sinus or hb symptoms. No unusual exp hx or h/o childhood pna/ asthma or knowledge of premature birth.  Sleeping ok without nocturnal  or early am exacerbation  of respiratory  c/o's or need for noct saba. Also denies any obvious fluctuation of symptoms with weather or environmental changes or other aggravating or alleviating factors except as outlined above   Current Medications, Allergies, Complete Past Medical History, Past Surgical History, Family History, and Social History were reviewed in [facility] electronic medical record.           Review of Systems  Constitutional: Negative for fever, chills, activity change, appetite change and unexpected weight change.  HENT: Positive for dental problem. Negative for congestion, postnasal drip, rhinorrhea, sneezing, sore throat, trouble swallowing and voice change.   Eyes: Negative for visual disturbance.  Respiratory: Negative for cough, choking and shortness of breath.   Cardiovascular: Positive for leg swelling. Negative for chest pain.  Gastrointestinal: Negative for nausea, vomiting and abdominal pain.  Genitourinary:  Negative for difficulty urinating.  Musculoskeletal: Negative for arthralgias.  Skin: Negative for rash.  Psychiatric/Behavioral: Negative for behavioral problems and confusion.       Objective:   Physical Exam   amb wm nad  Wt Readings from Last 3 Encounters:  10/20/15 132 lb (59.875 kg)  10/13/15 130 lb (58.968 kg)  09/29/15 142 lb 4.8 oz (64.547 kg)    Vital signs reviewed   HEENT:  Very poor  dentition,  Nl turbinates, and oropharynx. Nl external ear canals without cough reflex   NECK :  without JVD/Nodes/TM/ nl carotid upstrokes bilaterally   LUNGS: no acc muscle use,  Mild barrel contour chest which is clear to A and P bilaterally without cough on insp or exp maneuvers   CV:  RRR  no s3 or murmur or increase in P2, no edema   ABD:  soft and nontender with nl inspiratory excursion in the supine position. No bruits or organomegaly, bowel sounds nl  MS:  Nl gait/ ext warm without deformities, calf tenderness, cyanosis or clubbing No obvious joint restrictions   SKIN: warm and dry without lesions    NEURO:  alert, approp, nl sensorium with  no motor deficits      I personally reviewed images and agree with radiology impression as follows:  CXR: 10/13/15  Stable hyperinflation and apical scarring compatible with COPD. There is no acute cardiopulmonary abnormality.     Assessment & Plan:

## 2015-10-21 NOTE — Assessment & Plan Note (Signed)
Strongly advised to get dental care as risks sbe and lung infection/abscess

## 2015-10-21 NOTE — Assessment & Plan Note (Signed)
Spirometry 10/20/2015  FEV 2.51 (65%) ratio 58 though abn effort dep portion of f/v loop   He only has mild / mod dz at most and probably not really a GOLD II but presently no real limiting symptoms or tendency to aecopd so nothing to offer but strong rec he stop now   Total time devoted to counseling  = 25/1934m review case/cxrs/spirometry with pt/ discussion of options/alternatives/ giving and going over instructions (see avs)

## 2015-10-21 NOTE — Assessment & Plan Note (Signed)
>   3 min discussion I reviewed the Fletcher curve with the patient that basically indicates  if you quit smoking when your best day FEV1 is still well preserved (as is probably still   the case here)  it is highly unlikely you will progress to severe disease and informed the patient there was no medication on the market that has proven to alter the curve/ its downward trajectory  or the likelihood of progression of their disease.  Therefore stopping smoking and maintaining abstinence is the most important aspect of care, not choice of inhalers or for that matter, doctors.    Has quit date set for 10/12/15 > strongly encouraged to keep the date and used nicotine patches prn

## 2015-11-03 NOTE — Telephone Encounter (Signed)
Left message for pt to call.

## 2015-11-10 NOTE — Telephone Encounter (Signed)
Left message for pt to call.

## 2016-02-15 ENCOUNTER — Encounter: Payer: Self-pay | Admitting: Internal Medicine

## 2016-04-25 ENCOUNTER — Encounter: Payer: Self-pay | Admitting: Internal Medicine

## 2016-06-20 ENCOUNTER — Ambulatory Visit (INDEPENDENT_AMBULATORY_CARE_PROVIDER_SITE_OTHER): Payer: Managed Care, Other (non HMO) | Admitting: Internal Medicine

## 2016-06-20 ENCOUNTER — Encounter: Payer: Self-pay | Admitting: Internal Medicine

## 2016-06-20 VITALS — BP 110/66 | HR 73 | Temp 98.4°F | Ht 70.0 in | Wt 141.0 lb

## 2016-06-20 DIAGNOSIS — F22 Delusional disorders: Secondary | ICD-10-CM

## 2016-06-20 DIAGNOSIS — Z0000 Encounter for general adult medical examination without abnormal findings: Secondary | ICD-10-CM

## 2016-06-20 DIAGNOSIS — E038 Other specified hypothyroidism: Secondary | ICD-10-CM

## 2016-06-20 DIAGNOSIS — J449 Chronic obstructive pulmonary disease, unspecified: Secondary | ICD-10-CM

## 2016-06-20 DIAGNOSIS — E785 Hyperlipidemia, unspecified: Secondary | ICD-10-CM | POA: Diagnosis not present

## 2016-06-20 DIAGNOSIS — E890 Postprocedural hypothyroidism: Secondary | ICD-10-CM | POA: Diagnosis not present

## 2016-06-20 DIAGNOSIS — Z720 Tobacco use: Secondary | ICD-10-CM

## 2016-06-20 DIAGNOSIS — E7800 Pure hypercholesterolemia, unspecified: Secondary | ICD-10-CM

## 2016-06-20 DIAGNOSIS — F17210 Nicotine dependence, cigarettes, uncomplicated: Secondary | ICD-10-CM

## 2016-06-20 LAB — LIPID PANEL
CHOL/HDL RATIO: 4.3 ratio (ref <=5.0)
Cholesterol: 211 mg/dL — ABNORMAL HIGH (ref 125–200)
HDL: 49 mg/dL (ref >=40)
LDL Cholesterol: 119 mg/dL (ref <130)
Triglycerides: 216 mg/dL — ABNORMAL HIGH (ref <150)
VLDL: 43 mg/dL — ABNORMAL HIGH (ref <30)

## 2016-06-20 MED ORDER — RISPERIDONE 2 MG PO TABS: 1.0000 mg | ORAL_TABLET | Freq: Two times a day (BID) | ORAL | 11 refills | Status: DC

## 2016-06-20 MED ORDER — LEVOTHYROXINE SODIUM 125 MCG PO TABS: 125.0000 ug | ORAL_TABLET | Freq: Every day | ORAL | 11 refills | Status: DC

## 2016-06-20 NOTE — Assessment & Plan Note (Signed)
Smoking 1/2ppd, reportedly down from 1ppd 2 months ago. Discussed importance of smoking cessation. Offered cessation assistance, either with medications or other OTC methods. Patient declining at this time, as he would like to try to continue to cut down by himself.

## 2016-06-20 NOTE — Assessment & Plan Note (Signed)
Not currently medically treated as patient says he was on Lipitor before and doesn't want to try medications again. Discussed importance of controlling high cholesterol. Patient amenable to discussing again if lipid panel with abnormalities.  - Lipid panel today - Begin medication as needed pending results

## 2016-06-20 NOTE — Assessment & Plan Note (Signed)
Seen by psychiatrist in the past for this and anxiety. Currently well-controlled on Risperdal $RemoveBefor eDEID_[id number]$1mgoreDEID_mxVFVophBfjlvJyfcojZxDqbJnbEBlKs$1mg

## 2016-06-20 NOTE — Assessment & Plan Note (Signed)
Post-ablation. Currently taking Synthroid 125 mcg qd.  - TSH today - Continue Synthroid. Will adjust dose if needed pending today's TSH

## 2016-06-20 NOTE — Progress Notes (Signed)
Patient ID: Joshua Mcknight, male   DOB: 07-21-1961, 55 y.o.   MRN: [id number]  55 y.o. year old male presents for well male/preventative visit.  Patient formerly seen at another practice (PCP was Marin Commentheryl Wells, FNP), however he was frustrated that his prescriptions apparently could only be filled one month at a time. He felt he was not receiving good care, so transferred care to our clinic. He is here to establish care today. Of note, he was previously seen at our clinic a few years ago.   Acute Concerns:  Anxiety - Multiple visits with psychiatrist a few months ago. Started on risperidone. Sx well-controlled.   COPD - Not currently medicated. Reports has taken inhalers in the past with no symptomatic relief. Says breathing is fair most days, and he only has issues when he is sick. Has been seen by pulmonology before and told he was in the early stages of his disease. Has never required supplemental O2.   Tobacco abuse - Current every day smoker. Smoking 1/2 ppd now. Cut down from 1 ppd 2 months ago. Has tried Chantix in the past and said it gave him bad dreams. Has nicotine patches at home, but doesn't like to use them. Is not interested in trying any smoking cessation supplemental methods today. Says he is cutting down gradually.   Hypothyroidism - Wants TSH checked today. Says it should have been checked at his prior PCP's office and it never was. Thinks he is well-controlled on current dose of Synthroid. Denies fatigue, cold intolerance, hair loss, weight gain, or any other symptoms of hypothyroidism.   Social:  Social History   Social History  . Marital status: Married    Spouse name: N/A  . Number of children: N/A  . Years of education: N/A   Social History Main Topics  . Smoking status: Current Every Day Smoker    Packs/day: 1.00    Years: 45.00    Types: Cigarettes  . Smokeless tobacco: Never Used  . Alcohol use No  . Drug use: No  . Sexual activity: Not Asked   Other Topics  Concern  . None   Social History Narrative  . None    Immunization: Immunization History  Administered Date(s) Administered  . Pneumococcal Polysaccharide-23 08/28/2007  . Td 09/05/2006, 08/25/2012    Cancer Screening:  Colonoscopy: Thinks he may have had one time, but cannot remember when. Says it was a very unpleasant experience, and he does not want another colonoscopy. He realizes the benefits and risks of not having regular colonoscopies, but still does not want one.    Physical Exam: VITALS: Reviewed GEN: Pleasant male, NAD HEENT: Normocephalic, PERRL, EOMI, no scleral icterus, MMM, uvula midline, no anterior or posterior lymphadenopathy, no thyromegaly. Poor dentition.  CARDIAC:RRR, S1 and S2 present, no murmur, no heaves/thrills RESP: CTAB, normal effort ABD: Soft, no tenderness, normal bowel sounds SKIN: No rash  ASSESSMENT & PLAN: 55 y.o. male presents for annual well male/preventative exam. Please see problem specific assessment and plan.   Hypothyroidism Post-ablation. Currently taking Synthroid 125 mcg qd.  - TSH today - Continue Synthroid. Will adjust dose if needed pending today's TSH  COPD GOLD II criteria though poor effort/ still smoking  Stable. Reports respiratory status as "fair." Offered to begin maintenance therapy today; patient declined. Will continue to monitor.   Cigarette smoker Smoking 1/2ppd, reportedly down from 1ppd 2 months ago. Discussed importance of smoking cessation. Offered cessation assistance, either with medications or other OTC methods. Patient declining  at this time, as he would like to try to continue to cut down by himself.   HYPERCHOLESTEROLEMIA Not currently medically treated as patient says he was on Lipitor before and doesn't want to try medications again. Discussed importance of controlling high cholesterol. Patient amenable to discussing again if lipid panel with abnormalities.  - Lipid panel today - Begin medication as  needed pending results  Acute paranoia (HCC) Seen by psychiatrist in the past for this and anxiety. Currently well-controlled on Risperdal 1mg  BID.  - Refilled Risperdal 1mg  BID  Healthcare maintenance - HIV, Hep C today as patient has never been screened before - Discussed importance of colonoscopy. Patient declines.  - Lipid panel today. Patient currently declining treatment, but will consider if cholesterol still elevated.  - Declines flu shot today  Tarri AbernethyAbigail J Lancaster, MD, MPH PGY-2 [facility] Pager [phone]

## 2016-06-20 NOTE — Patient Instructions (Addendum)
It was nice meeting you today Mr. Joshua Mcknight!  I will call to let you know if there are any abnormalities with your bloodwork.   If you would like to discuss smoking cessation anymore, please feel free to schedule an appointment at your earliest convenience.   We will see you back in one year for your next check up, or sooner if needed.   If you have any questions or concerns, please feel free to call the clinic.   Be well,  Dr. Natale MilchLancaster

## 2016-06-20 NOTE — Assessment & Plan Note (Signed)
Stable. Reports respiratory status as "fair." Offered to begin maintenance therapy today; patient declined. Will continue to monitor.

## 2016-06-20 NOTE — Assessment & Plan Note (Signed)
-   HIV, Hep C today as patient has never been screened before - Discussed importance of colonoscopy. Patient declines.  - Lipid panel today. Patient currently declining treatment, but will consider if cholesterol still elevated.  - Declines flu shot today

## 2016-06-21 ENCOUNTER — Encounter (HOSPITAL_COMMUNITY): Payer: Self-pay | Admitting: Internal Medicine

## 2016-06-21 LAB — HIV ANTIBODY (ROUTINE TESTING W REFLEX): HIV: NONREACTIVE

## 2016-06-21 LAB — TSH: TSH: 2.09 m[IU]/L (ref 0.40–4.50)

## 2016-06-21 LAB — HEPATITIS C ANTIBODY: HCV AB: NEGATIVE

## 2016-12-15 ENCOUNTER — Ambulatory Visit (INDEPENDENT_AMBULATORY_CARE_PROVIDER_SITE_OTHER): Payer: Self-pay | Admitting: Family

## 2016-12-15 ENCOUNTER — Encounter: Payer: Self-pay | Admitting: Family

## 2016-12-15 VITALS — BP 110/70 | HR 82 | Temp 98.6°F | Wt 137.2 lb

## 2016-12-15 DIAGNOSIS — R6889 Other general symptoms and signs: Secondary | ICD-10-CM

## 2016-12-15 DIAGNOSIS — J029 Acute pharyngitis, unspecified: Secondary | ICD-10-CM

## 2016-12-15 LAB — POCT INFLUENZA A/B
Influenza A, POC: NEGATIVE
Influenza B, POC: NEGATIVE

## 2016-12-15 MED ORDER — BENZONATATE 100 MG PO CAPS: 100.0000 mg | ORAL_CAPSULE | Freq: Three times a day (TID) | ORAL | 0 refills | Status: DC | PRN

## 2016-12-15 MED ORDER — GUAIFENESIN ER 600 MG PO TB12: 600.0000 mg | ORAL_TABLET | Freq: Two times a day (BID) | ORAL | 0 refills | Status: DC

## 2016-12-15 NOTE — Progress Notes (Addendum)
Joshua Mcknight  Chief Complaint  Patient presents with  . flu like symptoms      ICD-9-CM ICD-10-CM   1. Viral pharyngitis 462 J02.9 benzonatate (TESSALON) 100 MG capsule     guaiFENesin (MUCINEX) 600 MG 12 hr tablet  2. Flu-like symptoms 780.99 R68.89 POCT Influenza A/B    Patient Active Problem List   Diagnosis Date Noted  . Viral pharyngitis 12/15/2016  . 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 Varicose veins 09/29/2015  . Acute paranoia (HCC) 09/08/2015  . Chest pressure 02/04/2014  . Elevated coronary artery calcium score 02/04/2014  . Hemoptysis 05/15/2013  . Poor dentition 05/15/2013  . NSAID long-term use 12/05/2012  . Leg swelling 09/24/2012  . Back pain 09/24/2012  . Unintentional weight loss 02/08/2012  . Healthcare maintenance 09/18/2011  . HEMATURIA UNSPECIFIED 05/03/2010  . TINNITUS, CHRONIC, BILATERAL 02/12/2009  . Hypothyroidism 08/28/2007  . HYPERCHOLESTEROLEMIA 08/28/2007  . Cigarette smoker 08/28/2007    Past Medical History:  Diagnosis Date  . Emphysema of lung (HCC)   . Hyperlipidemia   . Thyroid disease     Past Surgical History:  Procedure Laterality Date  . KNEE SURGERY Left age 52    Allergies  Allergen Reactions  . Coconut Oil Rash    BP 110/70   Pulse 82   Temp 98.6 F (37 C)   Wt 137 lb 3.2 oz (62.2 kg)   SpO2 97%   BMI 19.69 kg/m   Review of Systems  Constitutional: Positive for chills and fever.  HENT: Positive for congestion.   All other systems reviewed and are negative.  Physical Exam  Constitutional: He is oriented to person, place, and time. He appears well-developed and well-nourished.  HENT:  Head: Normocephalic and atraumatic.  Eyes: Conjunctivae and EOM are normal. Pupils are equal, round, and reactive to light.  Neck: Normal range of motion. Neck supple.  Cardiovascular: Normal rate, regular rhythm and normal heart sounds.   Pulmonary/Chest: Effort normal and breath sounds  normal.  Abdominal: Soft. Bowel sounds are normal.  Musculoskeletal: Normal range of motion.  Neurological: He is alert and oriented to person, place, and time.  Skin: Skin is warm and dry. Capillary refill takes less than 2 seconds.    Results for orders placed or performed in visit on 12/15/16 (from the past 72 hour(s))  POCT Influenza A/B     Status: None   Collection Time: 12/15/16 11:39 AM  Result Value Ref Range   Influenza A, POC Negative Negative   Influenza B, POC Negative Negative     Current Outpatient Prescriptions:  .  benzonatate (TESSALON) 100 MG capsule, Take 1-2 capsules (100-200 mg total) by mouth every 8 (eight) hours as needed for cough., Disp: 30 capsule, Rfl: 0 .  guaiFENesin (MUCINEX) 600 MG 12 hr tablet, Take 1 tablet (600 mg total) by mouth 2 (two) times daily., Disp: 10 tablet, Rfl: 0 .  levothyroxine (SYNTHROID, LEVOTHROID) 125 MCG tablet, Take 1 tablet (125 mcg total) by mouth daily before breakfast., Disp: 30 tablet, Rfl: 11 .  risperiDONE (RISPERDAL) 2 MG tablet, Take 0.5 tablets (1 mg total) by mouth 2 (two) times daily., Disp: 30 tablet, Rfl: 11  Subjective: "I have been kinda hot/cold the past day or 2. Some coughing up mucus. No sore throat. No sinus congestion."  Objective: Pt seen and chart reviewed. Pt is alert, oriented x3, calm, cooperative, and in NAD.   Assessment: Viral pharyngitis   Plan: Mucinex  600mg  po bid x 5 days, Tessalon 100mg  , take 1-2 capsules every 8h prn cough.   Beau FannyWithrow, Tiernan Suto C, FNP 12/15/2016 12:12 PM   Addendum: 12/17/16.Marland Kitchen.the patient called and is doing much worse with fever near 102 and more sputum production. Sent Abx (Zpack).

## 2016-12-17 MED ORDER — AZITHROMYCIN 250 MG PO TABS: ORAL_TABLET | ORAL | 0 refills | Status: DC

## 2016-12-17 NOTE — Addendum Note (Signed)
Addended by: Beau FannyWITHROW, Amna Welker C on: 12/17/2016 11:46 AM   Modules accepted: Orders

## 2016-12-19 ENCOUNTER — Encounter: Payer: Self-pay | Admitting: Family Medicine

## 2016-12-19 ENCOUNTER — Ambulatory Visit (INDEPENDENT_AMBULATORY_CARE_PROVIDER_SITE_OTHER): Payer: BLUE CROSS/BLUE SHIELD | Admitting: Family Medicine

## 2016-12-19 DIAGNOSIS — J069 Acute upper respiratory infection, unspecified: Secondary | ICD-10-CM | POA: Diagnosis not present

## 2016-12-19 DIAGNOSIS — B9789 Other viral agents as the cause of diseases classified elsewhere: Secondary | ICD-10-CM

## 2016-12-19 NOTE — Patient Instructions (Signed)
Cough - Upper Respiratory Infection (URI) Treatment - you should: - Push fluids the best you can with water and/or Gatorade. - Take over-the-counter ibuprofen and/or Tylenol as directed on the bottles for fever, pain, and/or inflammation. - For severe symptoms (fever, chills, body aches, and malaise) I would recommend:  - 650-1000mg  of Tylenol 3 times a day (max).   - 200-600mg  of Ibuprofen 4 times a day (close to max).   - Take with food and plenty of water. - Over-the-counter cough drops or sprays may help. - A spoonful of honey before bed can help soothe any sore throat you may have. - You can pickup some pseudoephedrine (Sudafed) from your local pharmacy. This may help with most of your symptoms while you are at work. However, I would recommend not taking this before bedtime as it has a tendency to keep people awake.  Also, only take this when surely necessary as it can cause some "rebound effects" with worsening congestion and runny nose. - Over-the-counter nasal saline spray may also help with any nasal irritation/congestion you may have or develop.  You should be better in: 5-7 days Call us if you have severe shortness of breath, high fever or are not better in 2 weeks

## 2016-12-19 NOTE — Progress Notes (Signed)
COUGH Since Thursday. Coughing up green mucous. Fever peaking at 102F 2 nights ago. Taking ibuprofen for the fever.  Has been coughing for 5 days. Cough is: productive Sputum production: yes. green Medications tried: ibuprofen, just given Azithro/mucinex/tessalon on 12/15/16 >> seems to be making improvements. Taking blood pressure medications: none  Symptoms Runny nose: yes Mucous in back of throat: minimal Throat burning or reflux: no Wheezing or asthma: no, but h/o of COPD Fever: yes Chest Pain: no Shortness of breath: some Leg swelling: some but not significant Hemoptysis: no Weight loss: no  ROS see HPI Smoking Status noted  CC, SH/smoking status, and VS noted  Objective: BP 108/64   Pulse 77   Temp 98 F (36.7 C) (Oral)   Ht 5\' 10"  (1.778 m)   Wt 138 lb 6.4 oz (62.8 kg)   SpO2 96%   BMI 19.86 kg/m  Gen: NAD, alert, cooperative. HEENT: MMM, EOMI, PERRLA, OP erythematous without evidence of exudates, TMs clear bilaterally, no LAD, neck full ROM. CV: Well-perfused. RRR Resp: Non-labored. CTAB Neuro: Sensation intact throughout.   Assessment and plan:  URI (upper respiratory infection) Patient is here with signs and symptoms consistent with URI. No current evidence suggesting bacterial infection >> but these may have resolved since symptom onset as patient was seen in another office and treated with azithromycin. Lung sounds were clear. OP slightly erythematous without any exudate. - Continue taking azithromycin until completion. - Previously prescribed Mucinex as needed for sputum - Discussed importance of adequate hydration. - Tylenol/ibuprofen as needed for fevers/discomfort. - Over-the-counter antitussives for cough PRN. - Nightly spoonful of honey for sore throat PRN - Strongly encouraged smoking cessation - Discussed expectations with possibility of persistent cough lasting up to 6-8 weeks. - Return precautions discussed.  Other: Patient was asking for  FMLA paperwork to be filled out today. I informed him that this needs to be done by his PCP and that she would likely require an office visit for this to occur. Patient stated his understanding and said he would schedule an appointment on his way out.   Kathee DeltonIan D Nafis Farnan, MD,MS,  PGY3 12/19/2016 1:29 PM

## 2016-12-19 NOTE — Assessment & Plan Note (Signed)
Patient is here with signs and symptoms consistent with URI. No current evidence suggesting bacterial infection >> but these may have resolved since symptom onset as patient was seen in another office and treated with azithromycin. Lung sounds were clear. OP slightly erythematous without any exudate. - Continue taking azithromycin until completion. - Previously prescribed Mucinex as needed for sputum - Discussed importance of adequate hydration. - Tylenol/ibuprofen as needed for fevers/discomfort. - Over-the-counter antitussives for cough PRN. - Nightly spoonful of honey for sore throat PRN - Strongly encouraged smoking cessation - Discussed expectations with possibility of persistent cough lasting up to 6-8 weeks. - Return precautions discussed.  Other: Patient was asking for FMLA paperwork to be filled out today. I informed him that this needs to be done by his PCP and that she would likely require an office visit for this to occur. Patient stated his understanding and said he would schedule an appointment on his way out.

## 2016-12-21 ENCOUNTER — Encounter (HOSPITAL_COMMUNITY): Payer: Self-pay | Admitting: Emergency Medicine

## 2016-12-21 ENCOUNTER — Emergency Department (HOSPITAL_COMMUNITY)
Admission: EM | Admit: 2016-12-21 | Discharge: 2016-12-21 | Disposition: A | Discharge status: Home / Self Care | Payer: BLUE CROSS/BLUE SHIELD | Attending: Emergency Medicine | Admitting: Emergency Medicine

## 2016-12-21 ENCOUNTER — Emergency Department (HOSPITAL_COMMUNITY): Payer: BLUE CROSS/BLUE SHIELD

## 2016-12-21 ENCOUNTER — Other Ambulatory Visit: Payer: Self-pay

## 2016-12-21 DIAGNOSIS — R059 Cough, unspecified: Secondary | ICD-10-CM

## 2016-12-21 DIAGNOSIS — R05 Cough: Secondary | ICD-10-CM | POA: Diagnosis not present

## 2016-12-21 DIAGNOSIS — J449 Chronic obstructive pulmonary disease, unspecified: Secondary | ICD-10-CM | POA: Diagnosis not present

## 2016-12-21 DIAGNOSIS — E039 Hypothyroidism, unspecified: Secondary | ICD-10-CM | POA: Insufficient documentation

## 2016-12-21 DIAGNOSIS — Z79899 Other long term (current) drug therapy: Secondary | ICD-10-CM | POA: Insufficient documentation

## 2016-12-21 DIAGNOSIS — F17210 Nicotine dependence, cigarettes, uncomplicated: Secondary | ICD-10-CM | POA: Insufficient documentation

## 2016-12-21 DIAGNOSIS — R079 Chest pain, unspecified: Secondary | ICD-10-CM | POA: Diagnosis not present

## 2016-12-21 LAB — CBC
HCT: 36.6 % — ABNORMAL LOW (ref 39.0–52.0)
Hemoglobin: 12.8 g/dL — ABNORMAL LOW (ref 13.0–17.0)
MCH: 29.3 pg (ref 26.0–34.0)
MCHC: 35 g/dL (ref 30.0–36.0)
MCV: 83.8 fL (ref 78.0–100.0)
Platelets: 247 10*3/uL (ref 150–400)
RBC: 4.37 MIL/uL (ref 4.22–5.81)
RDW: 12.4 % (ref 11.5–15.5)
WBC: 11.3 10*3/uL — ABNORMAL HIGH (ref 4.0–10.5)

## 2016-12-21 LAB — BASIC METABOLIC PANEL
Anion gap: 7 (ref 5–15)
BUN: 6 mg/dL (ref 6–20)
CO2: 29 mmol/L (ref 22–32)
Calcium: 9.2 mg/dL (ref 8.9–10.3)
Chloride: 101 mmol/L (ref 101–111)
Creatinine, Ser: 0.86 mg/dL (ref 0.61–1.24)
GFR calc Af Amer: >60 mL/min (ref >60)
GFR calc non Af Amer: >60 mL/min (ref >60)
Glucose, Bld: 73 mg/dL (ref 65–99)
Potassium: 3.1 mmol/L — ABNORMAL LOW (ref 3.5–5.1)
Sodium: 137 mmol/L (ref 135–145)

## 2016-12-21 LAB — I-STAT TROPONIN, ED: Troponin i, poc: 0 ng/mL (ref 0.00–0.08)

## 2016-12-21 MED ORDER — POTASSIUM CHLORIDE CRYS ER 20 MEQ PO TBCR
40.0000 meq | EXTENDED_RELEASE_TABLET | Freq: Once | ORAL | Status: AC
  Administered 2016-12-21: 40 meq via ORAL
  Filled 2016-12-21: qty 2

## 2016-12-21 MED ORDER — IPRATROPIUM-ALBUTEROL 0.5-2.5 (3) MG/3ML IN SOLN
3.0000 mL | Freq: Once | RESPIRATORY_TRACT | Status: AC
  Administered 2016-12-21: 3 mL via RESPIRATORY_TRACT
  Filled 2016-12-21: qty 3

## 2016-12-21 MED ORDER — PREDNISONE 20 MG PO TABS: 40.0000 mg | ORAL_TABLET | Freq: Every day | ORAL | 0 refills | Status: DC

## 2016-12-21 MED ORDER — GUAIFENESIN-CODEINE 100-10 MG/5ML PO SOLN
10.0000 mL | Freq: Once | ORAL | Status: AC
  Administered 2016-12-21: 10 mL via ORAL
  Filled 2016-12-21: qty 10

## 2016-12-21 MED ORDER — METHYLPREDNISOLONE SODIUM SUCC 125 MG IJ SOLR
125.0000 mg | Freq: Once | INTRAMUSCULAR | Status: AC
  Administered 2016-12-21: 125 mg via INTRAVENOUS
  Filled 2016-12-21: qty 2

## 2016-12-21 NOTE — ED Provider Notes (Signed)
[facility]-EMERGENCY DEPT Provider Note    By signing my name below, I, Earmon Phoenix, attest that this documentation has been prepared under the direction and in the presence of Raeford Razor, MD. Electronically Signed: Earmon Phoenix, ED Scribe. 12/21/16. 12:13 PM.   History   Chief Complaint Chief Complaint  Patient presents with  . Chest Pain  . Cough    The history is provided by the patient and medical records. No language interpreter was used.    Joshua Mcknight is a 56 y.o. male with PMHx of emphysema, HLD and thyroid disease who presents to the Emergency Department complaining of a cough that began about one week ago. He reports associated mild sore throat (in the morning upon waking), mild right and centralized CP, SOB, light headedness and feeling disoriented stating he cannot focus on what he is doing. Pt states he saw his PCP at [facility] 9 days ago and was diagnosed with a URI and was treated with Zithromax that he finished this morning. He has not taken anything for pain relief. He denies modifying factors. He denies fever, chills, nausea, vomiting, leg swelling. He reports he is a smoker. He denies any PMHx of cardiac issues. He denies night sweats or weight loss.   Past Medical History:  Diagnosis Date  . Emphysema of lung (HCC)   . Hyperlipidemia   . Thyroid disease     Patient Active Problem List   Diagnosis Date Noted  . URI (upper respiratory infection) 12/19/2016  . 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 Varicose veins 09/29/2015  . Acute paranoia (HCC) 09/08/2015  . Chest pressure 02/04/2014  . Elevated coronary artery calcium score 02/04/2014  . Hemoptysis 05/15/2013  . Poor dentition 05/15/2013  . NSAID long-term use 12/05/2012  . Leg swelling 09/24/2012  . Back pain 09/24/2012  . Unintentional weight loss 02/08/2012  . HEMATURIA UNSPECIFIED 05/03/2010  . TINNITUS, CHRONIC, BILATERAL 02/12/2009  .  Hypothyroidism 08/28/2007  . HYPERCHOLESTEROLEMIA 08/28/2007  . Cigarette smoker 08/28/2007    Past Surgical History:  Procedure Laterality Date  . KNEE SURGERY Left age 31       Home Medications    Prior to Admission medications   Medication Sig Start Date End Date Taking? Authorizing Provider  azithromycin (ZITHROMAX) 250 MG tablet Take 2 tabs now then 1 daily times 4 days. 12/17/16  Yes Beau Fanny, FNP  levothyroxine (SYNTHROID, LEVOTHROID) 125 MCG tablet Take 1 tablet (125 mcg total) by mouth daily before breakfast. 06/20/16  Yes Marquette Saa, MD  risperiDONE (RISPERDAL) 2 MG tablet Take 0.5 tablets (1 mg total) by mouth 2 (two) times daily. 06/20/16  Yes Marquette Saa, MD  benzonatate (TESSALON) 100 MG capsule Take 1-2 capsules (100-200 mg total) by mouth every 8 (eight) hours as needed for cough. Patient not taking: Reported on 12/21/2016 12/15/16   Beau Fanny, FNP  guaiFENesin (MUCINEX) 600 MG 12 hr tablet Take 1 tablet (600 mg total) by mouth 2 (two) times daily. Patient not taking: Reported on 12/21/2016 12/15/16   Beau Fanny, FNP    Family History Family History  Problem Relation Age of Onset  . CAD Father     CABG at 63  . Aneurysm Father   . Hyperlipidemia Mother   . Hypertension Mother   . Edema Mother   . Other Brother     artery blockage  . Bronchitis Brother   . Diabetes Mellitus I Brother  Social History Social History  Substance Use Topics  . Smoking status: Current Every Day Smoker    Packs/day: 1.00    Years: 45.00    Types: Cigarettes  . Smokeless tobacco: Never Used  . Alcohol use No     Allergies   Food   Review of Systems Review of Systems A complete 10 system review of systems was obtained and all systems are negative except as noted in the HPI and PMH.    Physical Exam Updated Vital Signs BP 111/76 (BP Location: Left Arm)   Pulse 72   Temp 97.9 F (36.6 C) (Oral)   Resp 17   Ht 5\' 10"  (1.778  m)   Wt 165 lb (74.8 kg)   SpO2 100%   BMI 23.68 kg/m   Physical Exam  Constitutional: He is oriented to person, place, and time. He appears well-developed and well-nourished.  HENT:  Head: Normocephalic.  Eyes: EOM are normal.  Neck: Normal range of motion.  Cardiovascular: Normal rate, regular rhythm and normal heart sounds.   Pulmonary/Chest: Effort normal.  Course breath sounds bilaterally. No lower extremity edema.  Abdominal: He exhibits no distension.  Musculoskeletal: Normal range of motion.  Neurological: He is alert and oriented to person, place, and time.  Psychiatric: He has a normal mood and affect.  Nursing note and vitals reviewed.    ED Treatments / Results  DIAGNOSTIC STUDIES: Oxygen Saturation is 100% on RA, normal by my interpretation.   COORDINATION OF CARE: 12:05 PM- Will order CXR and labs. Will order nebulizer treatment. Pt verbalizes understanding and agrees to plan.  Medications - No data to display  Labs (all labs ordered are listed, but only abnormal results are displayed) Labs Reviewed  BASIC METABOLIC PANEL - Abnormal; Notable for the following:       Result Value   Potassium 3.1 (*)    All other components within normal limits  CBC - Abnormal; Notable for the following:    WBC 11.3 (*)    Hemoglobin 12.8 (*)    HCT 36.6 (*)    All other components within normal limits  I-STAT TROPOININ, ED    EKG  EKG Interpretation None       Radiology Dg Chest 2 View  Result Date: 12/21/2016 CLINICAL DATA:  Cough for aproximately 1 week, mid chest pain EXAM: CHEST  2 VIEW COMPARISON:  10/13/2015 FINDINGS: Cardiomediastinal silhouette is stable. Bilateral apical pleuroparenchymal scarring again noted right greater than left. Mild hyperinflation. No infiltrate or pulmonary edema. Stable degenerative changes mid thoracic spine. IMPRESSION: Hyperinflation again noted. No active disease. Stable bilateral apical pleuroparenchymal scarring right  greater than left. Electronically Signed   By: Natasha MeadLiviu  Pop M.D.   On: 12/21/2016 11:46    Procedures Procedures (including critical care time)  Medications Ordered in ED Medications - No data to display   Initial Impression / Assessment and Plan / ED Course  I have reviewed the triage vital signs and the nursing notes.  Pertinent labs & imaging results that were available during my care of the patient were reviewed by me and considered in my medical decision making (see chart for details).   Final Clinical Impressions(s) / ED Diagnoses   Final diagnoses:  Cough    New Prescriptions New Prescriptions   No medications on file     Raeford RazorStephen Karelly Dewalt, MD 01/07/17 2332

## 2016-12-21 NOTE — ED Triage Notes (Addendum)
Patient c/o constant central chest pain with productive cough x1 week. Denies radiation. States PCP dx with URI on Tuesday. Reports completing abx yesterday. Denies N/V/D, abdominal pain and SOB. A&Ox4.

## 2016-12-22 ENCOUNTER — Encounter: Payer: Self-pay | Admitting: Internal Medicine

## 2016-12-22 ENCOUNTER — Ambulatory Visit (INDEPENDENT_AMBULATORY_CARE_PROVIDER_SITE_OTHER): Payer: BLUE CROSS/BLUE SHIELD | Admitting: Internal Medicine

## 2016-12-22 DIAGNOSIS — J069 Acute upper respiratory infection, unspecified: Secondary | ICD-10-CM

## 2016-12-22 DIAGNOSIS — B9789 Other viral agents as the cause of diseases classified elsewhere: Secondary | ICD-10-CM

## 2016-12-22 NOTE — Progress Notes (Signed)
   Subjective:   Patient: Joshua Mcknight       Birthdate: 1961-09-15       MRN: [id number]      HPI  Joshua Mcknight is a 56 y.o. male presenting for completion for FMLA paperwork.   Patient is requesting FMLA paperwork for missing the past week of work due to illness. He was told he must have the paperwork completed or he may be fired. Patient was seen at an urgent care facility on 02/16. At that time symptoms had already been present for 2 days. He was diagnosed with viral pharyngitis and given Mucinex and Tessalon perles. Patient called back on 02/18 and reported that he was febrile and his status had worsened. He was subsequently given azithromycin. [facility] for the same symptoms. He was told to continue current therapy at that time. He was also seen at [facility] ED yesterday for the same symptoms, and again told to continue current treatment. He says he is doing better today but is still coughing a lot. He says his coughing has gotten a little worse since receiving a breathing treatment [facility]. He has not had a fever in many days. He is eating and drinking normally. He has completed the course of azithromycin. Patient does have history of early stage COPD.   Smoking status reviewed. Patient is current every day smoker.   Review of Systems See HPI.     Objective:  Physical Exam  Constitutional: He is oriented to person, place, and time and well-developed, well-nourished, and in no distress.  HENT:  Head: Normocephalic and atraumatic.  Eyes: Conjunctivae and EOM are normal. Right eye exhibits no discharge. Left eye exhibits no discharge.  Pulmonary/Chest: Breath sounds normal. He has no wheezes.  Normal WOB on RA. Can speak in full sentences without difficulty. Minimal coughing during exam.   Neurological: He is alert and oriented to person, place, and time.  Skin: Skin is warm and dry.  Psychiatric: Affect and judgment normal.     Assessment & Plan:    URI (upper respiratory infection) Patient's symptoms improving so will continue with current symptomatic treatment plan as he has already completed course of abx. Completed FMLA paperwork today. F/u PRN.  Precepted with Dr. Pollie MeyerMcIntyre.   Tarri AbernethyAbigail J Sanel Stemmer, MD, MPH PGY-2 [facility] Pager [phone]

## 2016-12-22 NOTE — Assessment & Plan Note (Signed)
Patient's symptoms improving so will continue with current symptomatic treatment plan as he has already completed course of abx. Completed FMLA paperwork today. F/u PRN.

## 2017-06-28 ENCOUNTER — Other Ambulatory Visit: Payer: Self-pay | Admitting: Internal Medicine

## 2017-07-20 ENCOUNTER — Other Ambulatory Visit: Payer: Self-pay | Admitting: Internal Medicine

## 2017-07-30 ENCOUNTER — Ambulatory Visit (INDEPENDENT_AMBULATORY_CARE_PROVIDER_SITE_OTHER): Payer: BLUE CROSS/BLUE SHIELD | Admitting: Internal Medicine

## 2017-07-30 ENCOUNTER — Encounter: Payer: Self-pay | Admitting: Internal Medicine

## 2017-07-30 VITALS — BP 110/70 | HR 64 | Temp 98.2°F | Ht 70.0 in | Wt 143.0 lb

## 2017-07-30 DIAGNOSIS — J449 Chronic obstructive pulmonary disease, unspecified: Secondary | ICD-10-CM | POA: Diagnosis not present

## 2017-07-30 DIAGNOSIS — F22 Delusional disorders: Secondary | ICD-10-CM

## 2017-07-30 DIAGNOSIS — E039 Hypothyroidism, unspecified: Secondary | ICD-10-CM

## 2017-07-30 DIAGNOSIS — F17210 Nicotine dependence, cigarettes, uncomplicated: Secondary | ICD-10-CM

## 2017-07-30 MED ORDER — LEVOTHYROXINE SODIUM 125 MCG PO TABS: ORAL_TABLET | ORAL | 3 refills | Status: DC

## 2017-07-30 NOTE — Assessment & Plan Note (Signed)
No longer followed by psych or taking risperdal. Patient does not think he needs medication anymore and says he feels well.  - Continue to monitor

## 2017-07-30 NOTE — Assessment & Plan Note (Addendum)
Stable. Denies any breathing difficulties or cough. Continues to smoke ~1ppd. Declines maintenance therapy.  - Continue to monitor

## 2017-07-30 NOTE — Assessment & Plan Note (Signed)
Currently smoking ~1ppd. No interest in quitting at this time.

## 2017-07-30 NOTE — Progress Notes (Signed)
56 y.o. year old male presents for well male/preventative visit.  Acute Concerns:  Hypothyroidism S/p ablation. Does remember year of procedure. Has been taking Synthroid qd. Is concerned that he may need a dose adjustment. Has been feeling "sluggish" and tired for the past 6 months. Says that as soon as he sits down in his chair at night he feels like he is going to fall asleep. Also has 6 pound weight gain in the past year, however says he does not have a very healthy diet. Denies skin changes, hair changes, depressed mood.   Paranoia Was previously taking risperdal  BID for this and was seeing a psychiatrist. Says he hasn't seen a psychiatrist in over a year now and stopped taking risperdal two months ago. [place] Spaniel it became too expensive and he couldn't tell a difference while taking the medication anyway.   Tobacco abuse Still smoking about a pack per day. Not interested in quitting.   COPD Still declining maintenance therapy. Denies any difficulty breathing or chronic cough.   Diet: Thinks he has been eating a little less than usual. Primarily eats out. Drinks mostly water.    Exercise: Mowing the grass.   Social:  Lives at home with his wife.  Surgical History: Past Surgical History:  Procedure Laterality Date  . KNEE SURGERY Left age 31    Allergies: Allergies  Allergen Reactions  . Food Shortness Of Breath, Rash and Other (See Comments)    Pt is allergic to coconut.      Social:  Social History   Social History  . Marital status: Married    Spouse name: N/A  . Number of children: N/A  . Years of education: N/A   Social History Main Topics  . Smoking status: Current Every Day Smoker    Packs/day: 1.00    Years: 45.00    Types: Cigarettes  . Smokeless tobacco: Never Used  . Alcohol use No  . Drug use: No  . Sexual activity: Not Asked   Other Topics Concern  . None   Social History Narrative  . None    Immunization: Immunization History   Administered Date(s) Administered  . Influenza-Unspecified 09/13/2016  . Pneumococcal Polysaccharide-23 08/28/2007  . Td 09/05/2006, 08/25/2012    Cancer Screening:  Colonoscopy: Had one done 20 years ago. Does not want another.  Physical Exam: VITALS: Reviewed GEN: Pleasant male, NAD HEENT: Normocephalic, PERRL, EOMI, no scleral icterus, bilateral TM pearly grey, nasal septum midline, MMM, uvula midline, no anterior or posterior lymphadenopathy, no thyromegaly CARDIAC: RRR, S1 and S2 present, no murmur, no heaves/thrills RESP: CTAB, normal effort ABD: Soft, no tenderness, normal bowel sounds EXT: No edema, 2+ radial and DP pulses SKIN: Warm and dry, no rash. Well-healing abrasion on palm of R hand with no discharge or signs of infection.   ASSESSMENT & PLAN: 56 y.o. male presents for annual well male/preventative exam. Please see problem specific assessment and plan.   COPD GOLD II criteria though poor effort/ still smoking  Stable. Denies any breathing difficulties or cough. Continues to smoke ~1ppd. Declines maintenance therapy.  - Continue to monitor  Hypothyroidism On Synthroid . Concerned dose may not be accurate as experiencing fatigue for the past 6 months. Think likely not 2/2 to hypothyroidism, as levels have remained consistent in the recent future, however will draw thyroid panel for patient reassurance.  - Thyroid panel today - Will call patient if dosage adjustment necessary, otherwise continue Synthroid qd  Acute paranoia (HCC) No  longer followed by psych or taking risperdal. Patient does not think he needs medication anymore and says he feels well.  - Continue to monitor  Cigarette smoker Currently smoking ~1ppd. No interest in quitting at this time.    Tarri Abernethy, MD, MPH PGY-3 [facility] Pager [phone]

## 2017-07-30 NOTE — Assessment & Plan Note (Signed)
On Synthroid . Concerned dose may not be accurate as experiencing fatigue for the past 6 months. Think likely not 2/2 to hypothyroidism, as levels have remained consistent in the recent future, however will draw thyroid panel for patient reassurance.  - Thyroid panel today - Will call patient if dosage adjustment necessary, otherwise continue Synthroid qd

## 2017-07-30 NOTE — Patient Instructions (Addendum)
It was nice seeing you again today Mr. Kiper!  I will call you if we need to make an adjustment to your Synthroid dose. Otherwise, continue taking per day as you have been.   I will see you back in one year for your next physical, or sooner if you need [place].   If you have any questions or concerns, please feel free to call the clinic.   Be well,  Dr. Natale Milch

## 2017-07-31 LAB — THYROID PANEL WITH TSH
Free Thyroxine Index: 2.8 (ref 1.2–4.9)
T3 Uptake Ratio: 27 % (ref 24–39)
T4, Total: 10.2 ug/dL (ref 4.5–12.0)
TSH: 0.487 u[IU]/mL (ref 0.450–4.500)

## 2017-09-19 IMAGING — CT CT CHEST W/ CM
2 of 4 series · 15 of 36 positions shown, 18 images · IV contrast (OMNIPAQUE)
Comparison: Chest radiograph of same date.  CT of 12/26/2013.

CLINICAL DATA: Pt was at work this am (about 2 hours ago) and
suddenly felt "lost" and dizzy. C/O new RT ankle pain but denies any
trauma as well as a "weird" feeling in chest that was "there one
minute and gone the next." Rt side chest pain^80mL OMNIPAQUE
IOHEXOL 300 MG/ML SOLN

EXAM:
CT CHEST WITH CONTRAST
TECHNIQUE: Multidetector CT imaging of the chest was performed during
intravenous contrast administration.
CONTRAST:  80mL OMNIPAQUE IOHEXOL 300 MG/ML  SOLN

[Series 2: chest with st · axial · 0.74mm/px · z∈[-141,+134]mm · 12 of 67 slices shown, 15 images]
[im 6/67  mediastinal]
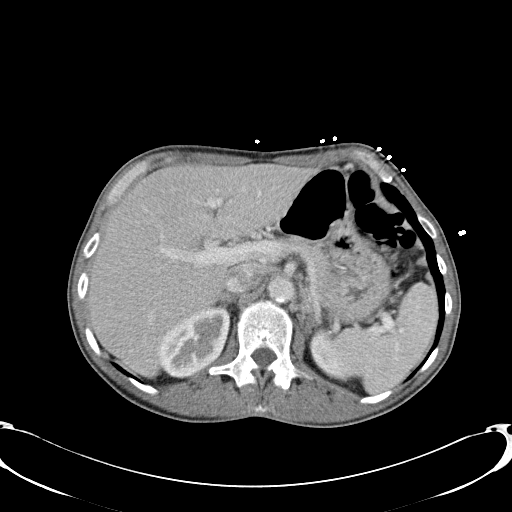
[im 6/67  lung]
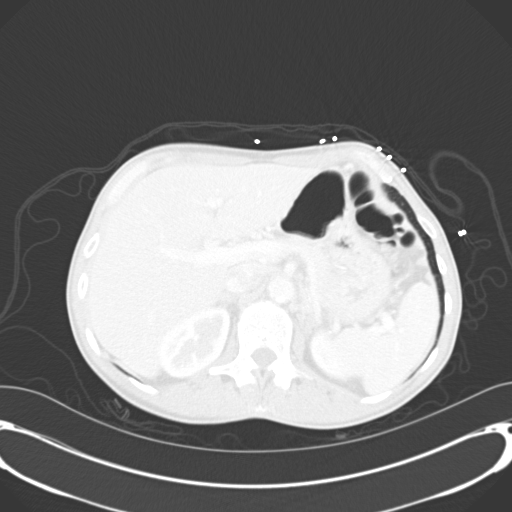
[im 11/67  lung]
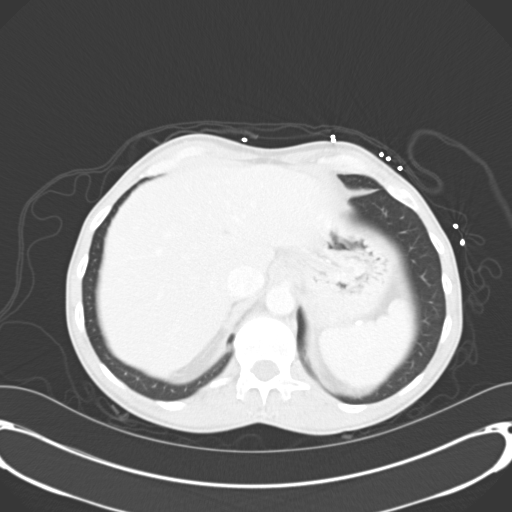
[im 16/67  lung]
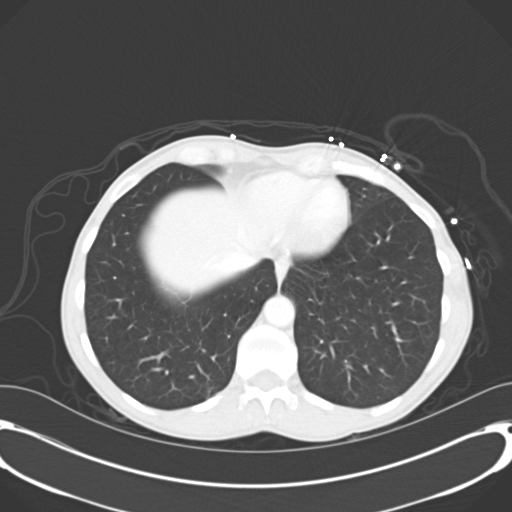
[im 21/67  lung]
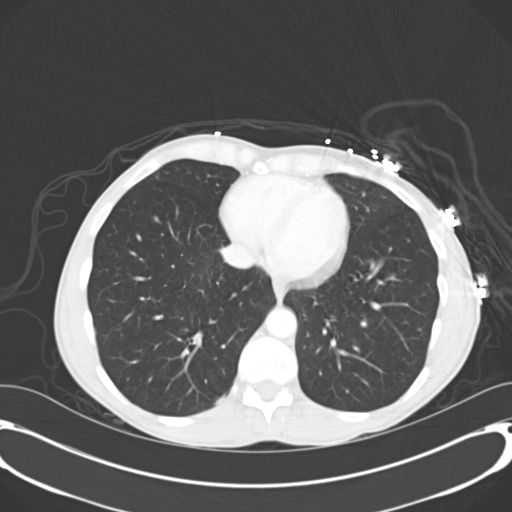
[im 26/67  mediastinal]
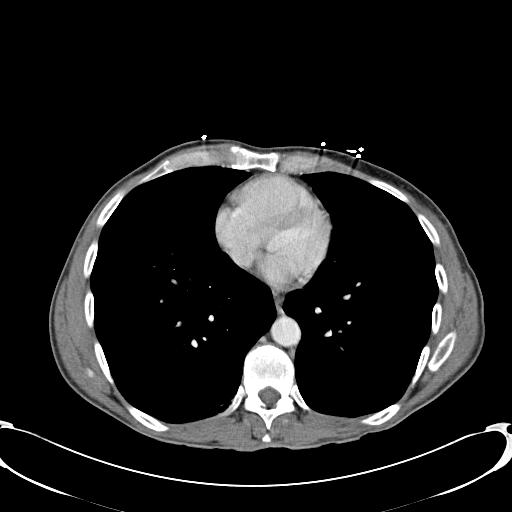
[im 26/67  lung]
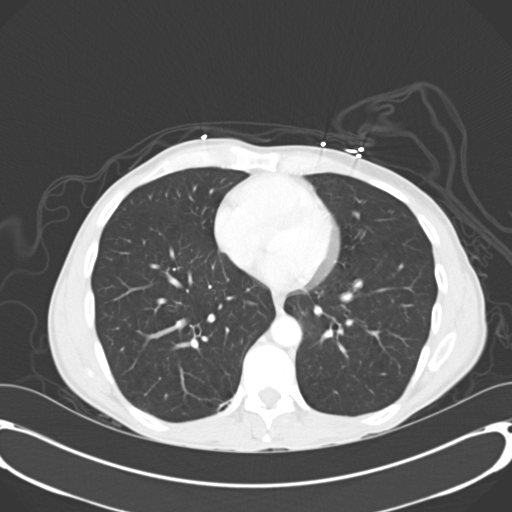
[im 31/67  lung]
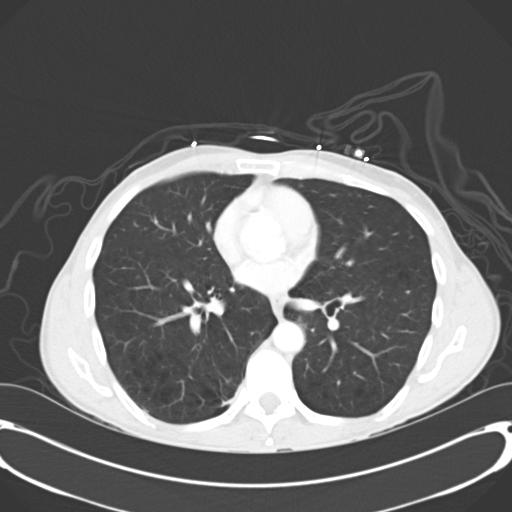
[im 36/67  lung]
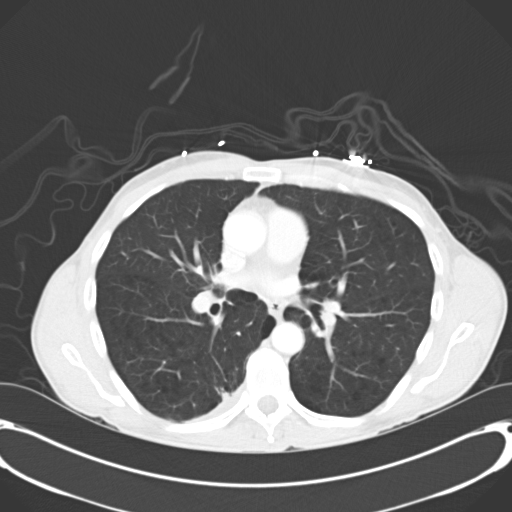
[im 41/67  lung]
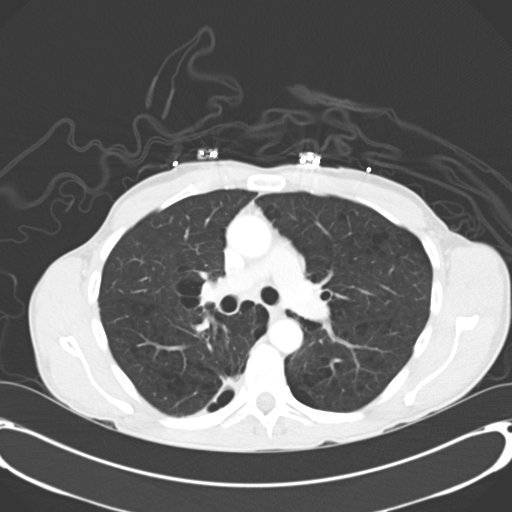
[im 46/67  mediastinal]
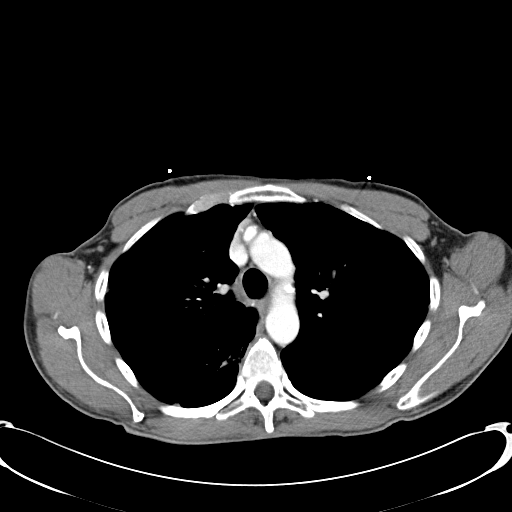
[im 46/67  lung]
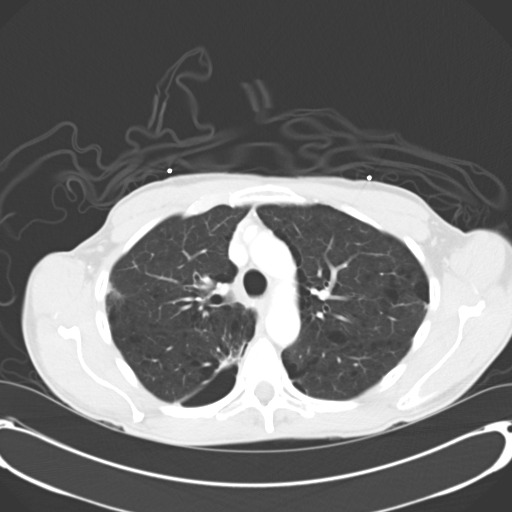
[im 51/67  lung]
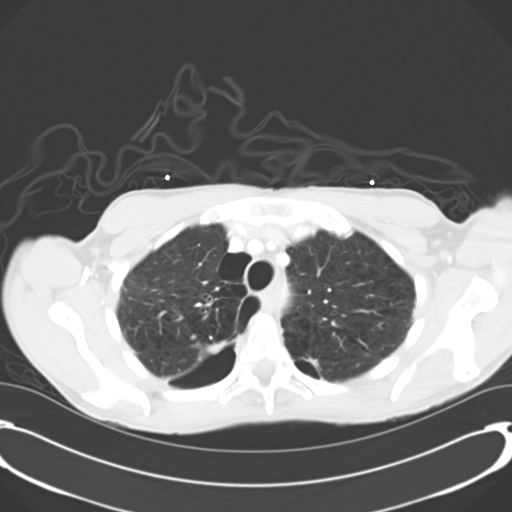
[im 56/67  lung]
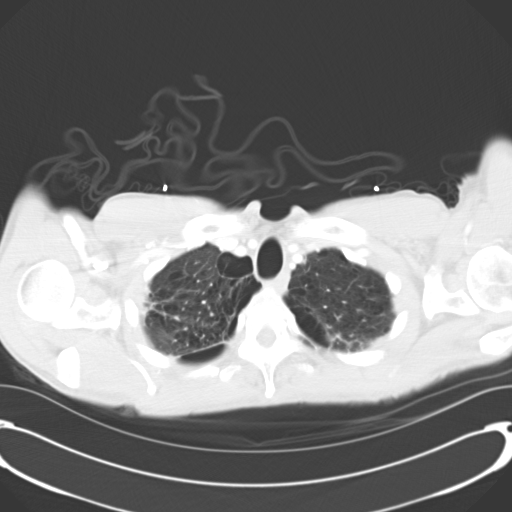
[im 61/67  lung]
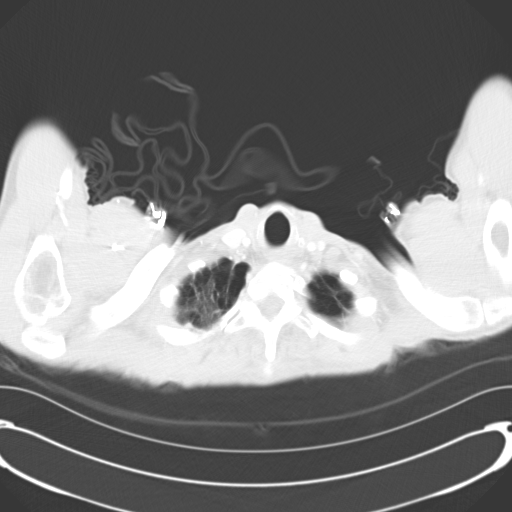

[Series 602: <mpr thick range> · coronal · 0.74mm/px · 3 of 83 slices shown]
[im 17/83  lung]
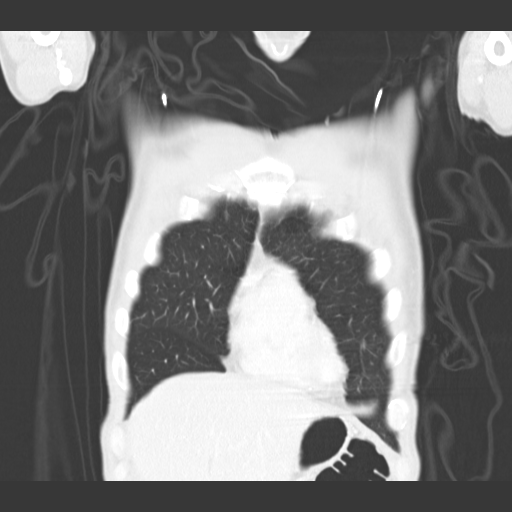
[im 33/83  lung]
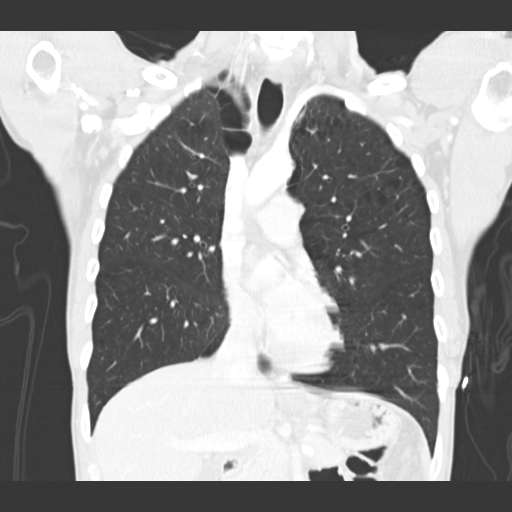
[im 50/83  lung]
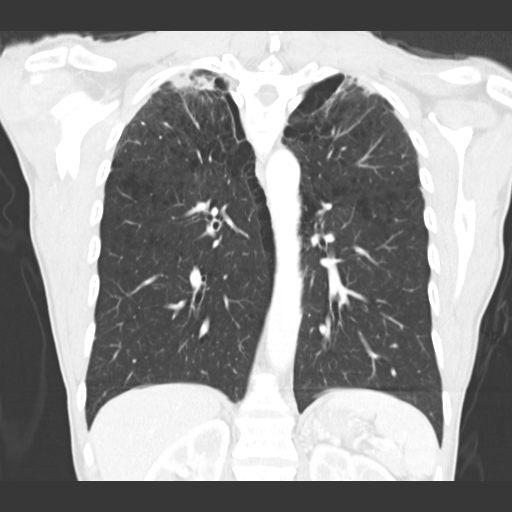

[15 of 36 positions shown; findings below may reference images not displayed]

FINDINGS: Mediastinum/Nodes: Normal heart size, without pericardial effusion.
LAD and right coronary artery atherosclerosis. No mediastinal or
hilar adenopathy.

Lungs/Pleura: Right-sided pleural thickening versus trace fluid
which is new since the prior CT (image 32, series 2).

Centrilobular and paraseptal emphysema which is moderate. Biapical
pleural parenchymal scarring.

An area of soft tissue density along the anterior aspect of a
posterior right apical bleb measures 12 by 10 mm (image 17, series
5). This is new since 12/26/2013.

Other areas of more linear increased density along the posterior
right lung are also identified (image 26, series 5).

Upper abdomen: Normal imaged portions of the liver, spleen, stomach,
pancreas, gallbladder, adrenal glands, kidneys. Abdominal aortic and
branch vessel atherosclerosis.

Musculoskeletal: Accentuation of expected thoracic kyphosis.
Thoracic spondylosis.
IMPRESSION: 1.  No acute process in the chest.
2. Centrilobular and paraseptal emphysema with biapical pleural
parenchymal scarring. An area of posterior right pleural thickening
versus trace fluid and adjacent increased subpleural pulmonary
density is favored to represent progressive scarring. A superimposed
acute Mild infectious process cannot be excluded. given the somewhat
nodular appearance within the posterior right apex, followup chest
CT at 3 months is recommended to confirm stability or resolution.
3. Age advanced coronary artery atherosclerosis. Recommend
assessment of coronary risk factors and consideration of medical
therapy.

## 2017-09-19 IMAGING — CT CT HEAD W/O CM
2 series · 16 of 30 positions shown, 20 images · non-contrast
Comparison: None.

CLINICAL DATA: Dizziness and frontal headache. Transient altered
mental status earlier today, resolved

EXAM:
CT HEAD WITHOUT CONTRAST
TECHNIQUE: Contiguous axial images were obtained from the base of the skull
through the vertex without intravenous contrast.

[Series 2: head w/o · axial · non-contrast · 0.45mm/px · z∈[-144,-19]mm · 13 of 31 slices shown, 17 images]
[im 3/31  brain]
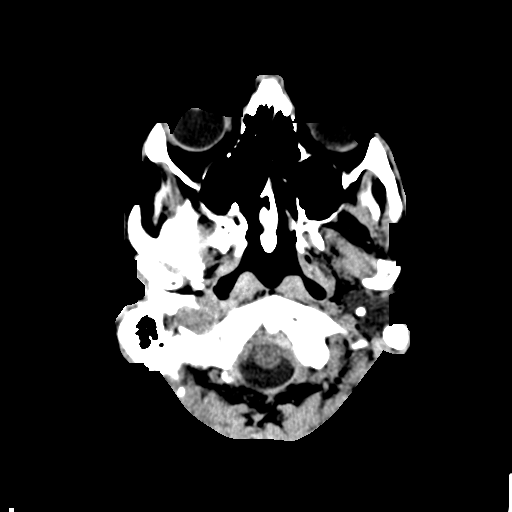
[im 3/31  bone]
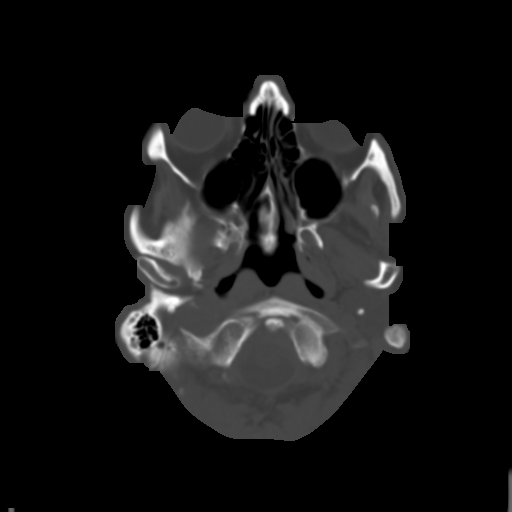
[im 5/31  brain]
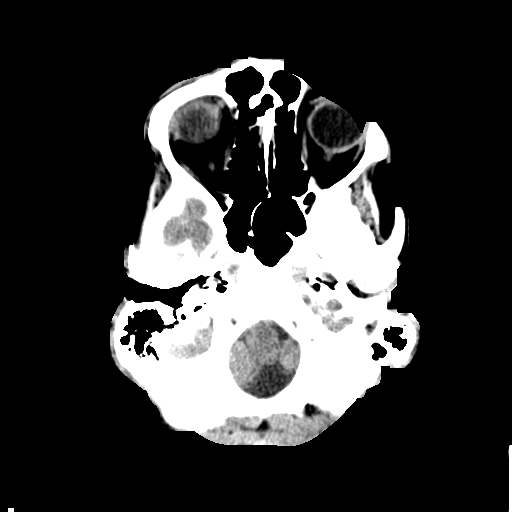
[im 7/31  brain]
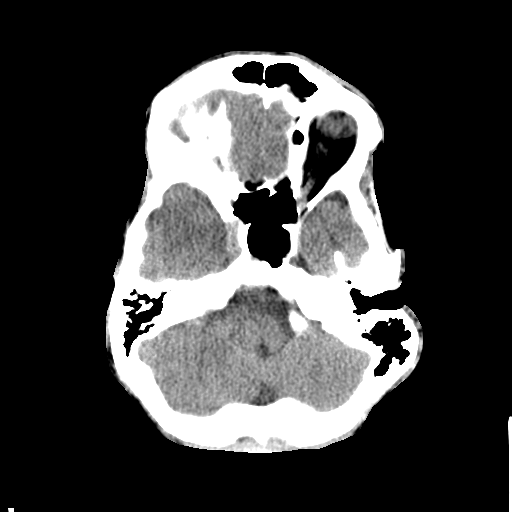
[im 9/31  brain]
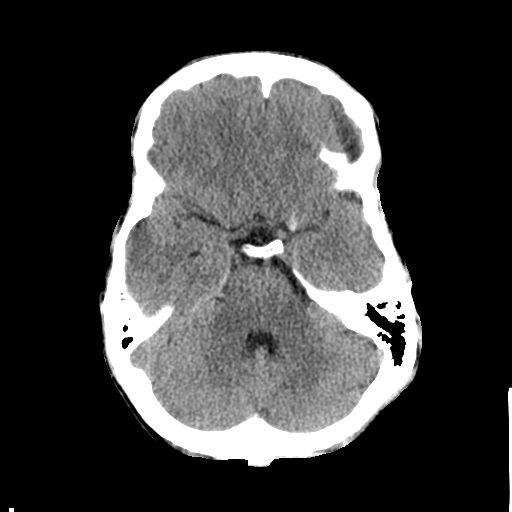
[im 11/31  brain]
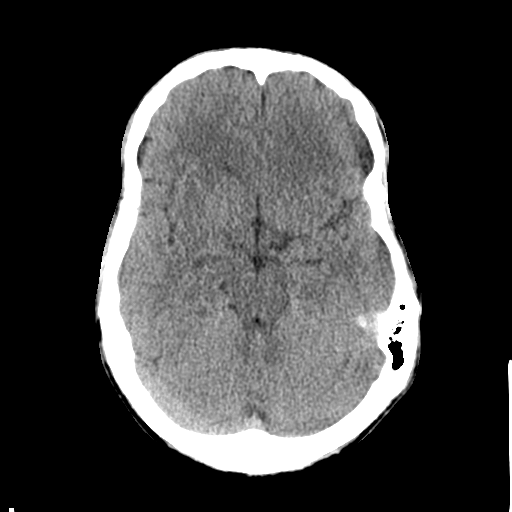
[im 11/31  bone]
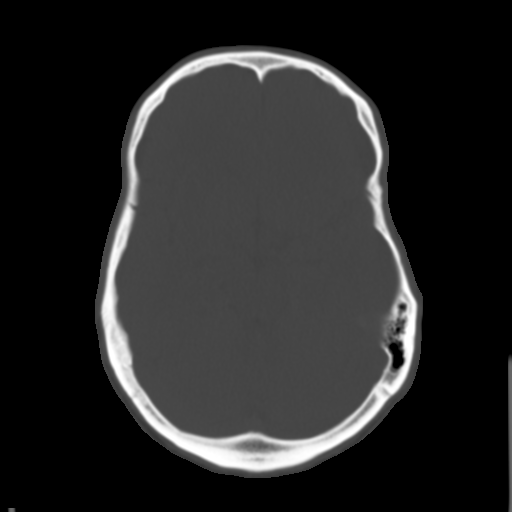
[im 13/31  brain]
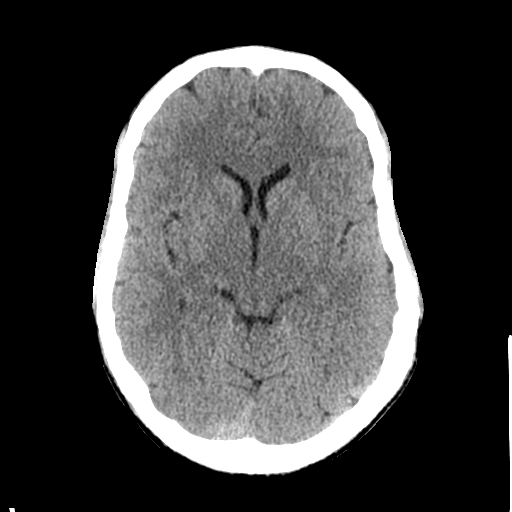
[im 16/31  brain]
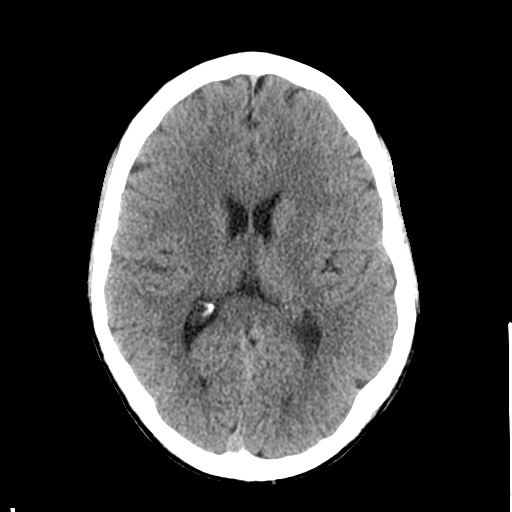
[im 18/31  brain]
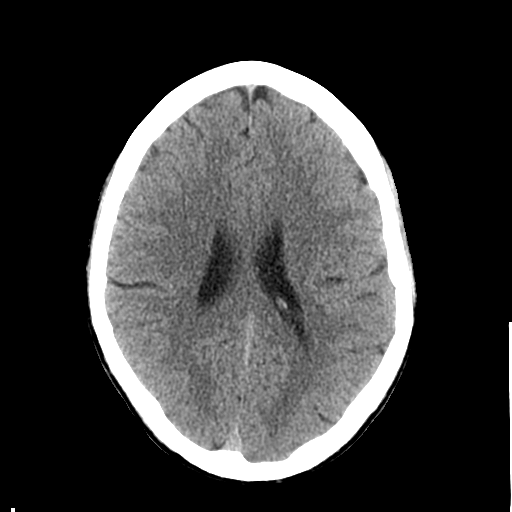
[im 20/31  brain]
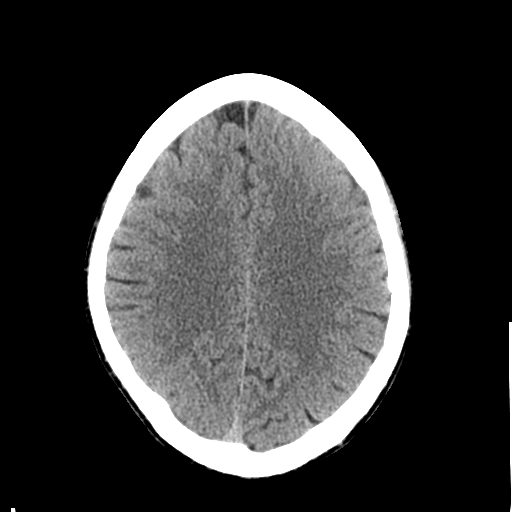
[im 20/31  bone]
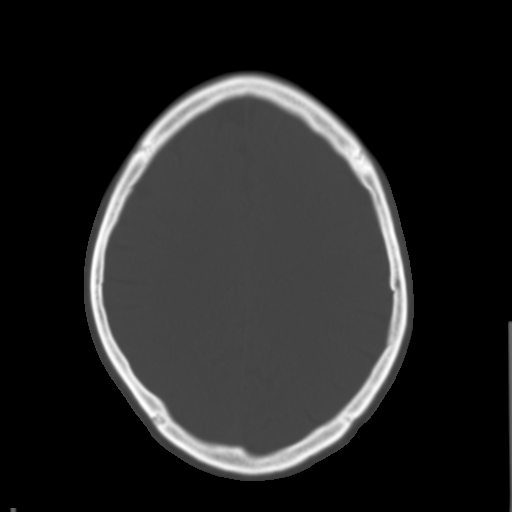
[im 22/31  brain]
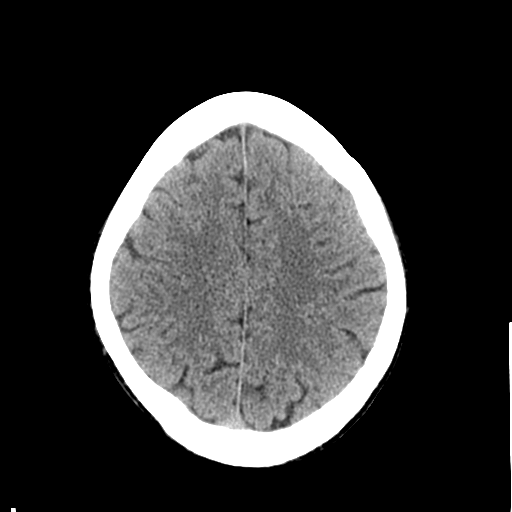
[im 24/31  brain]
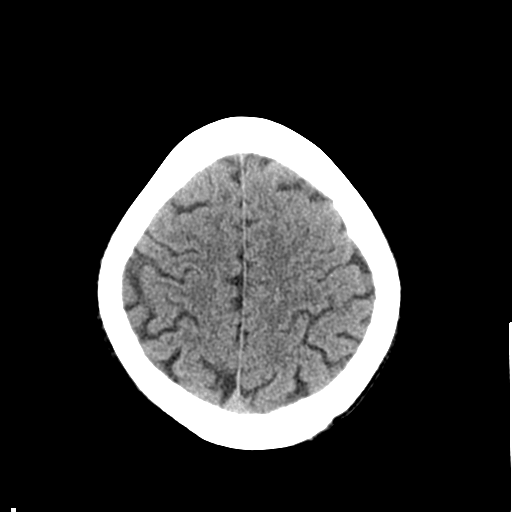
[im 26/31  brain]
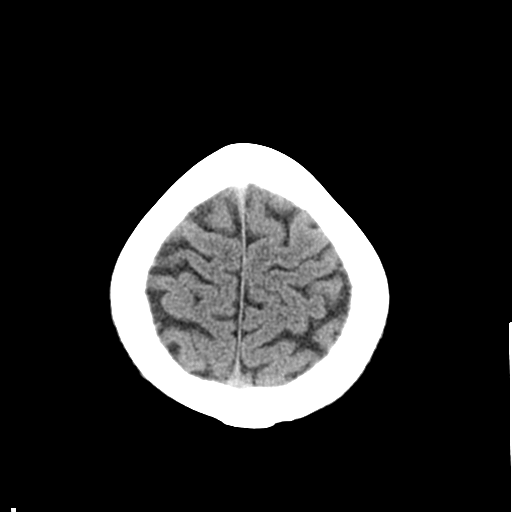
[im 28/31  brain]
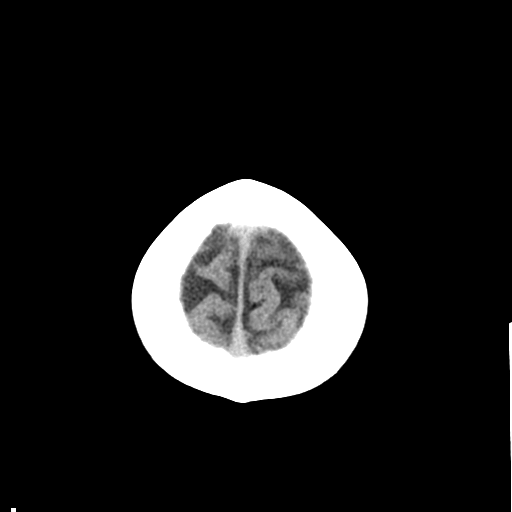
[im 28/31  bone]
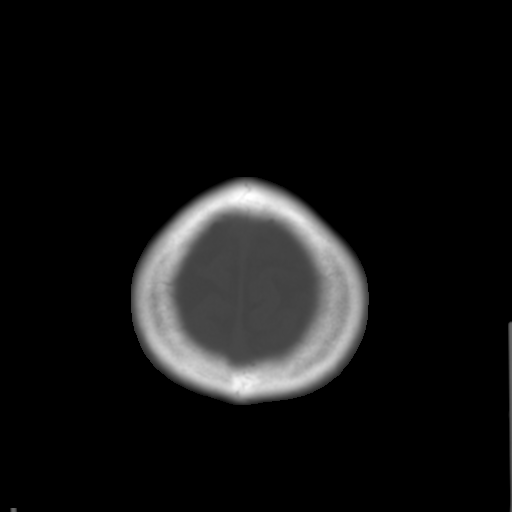

[Series 3: bone windows · axial · 0.45mm/px · z∈[-144,-104]mm · 3 of 31 slices shown]
[im 3/31  bone]
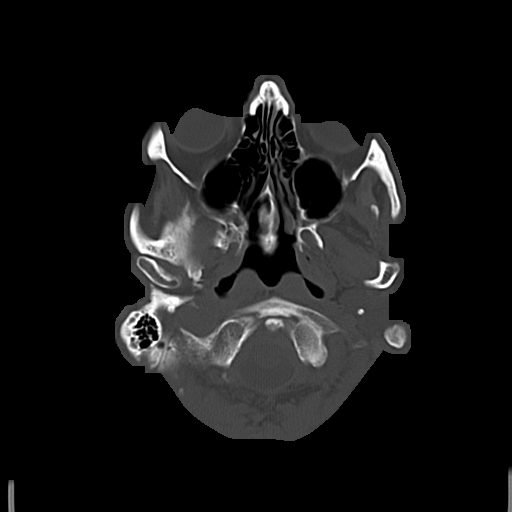
[im 7/31  bone]
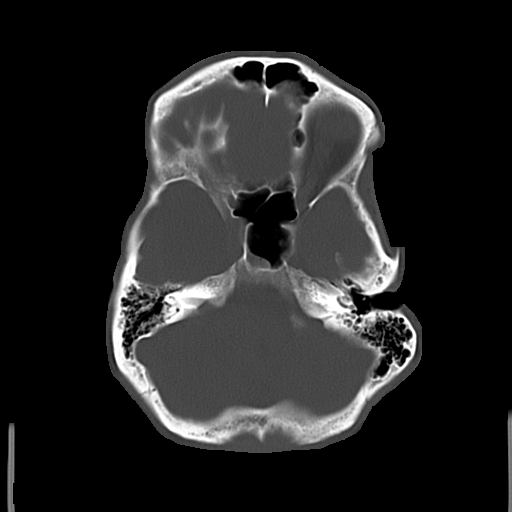
[im 11/31  bone]
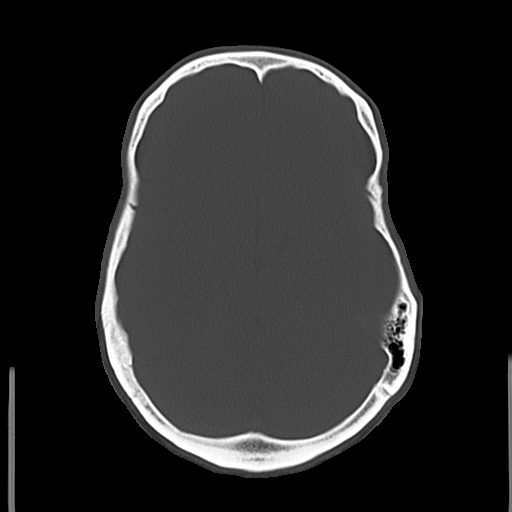

[16 of 30 positions shown; findings below may reference images not displayed]

FINDINGS: The ventricles are normal in size and configuration. There is no
intracranial mass, hemorrhage, extra-axial fluid collection, or
midline shift. The gray-white compartments are normal. No acute
infarct evident. The bony calvarium appears intact. The mastoid air
cells are clear.
IMPRESSION: Study within normal limits.

## 2017-11-08 ENCOUNTER — Telehealth: Payer: Self-pay | Admitting: *Deleted

## 2017-11-08 NOTE — Telephone Encounter (Signed)
Patient's wife left message on nurse line requesting refill on Risperdal. Joshua Mcknight. Shalita Notte, RN, BSN

## 2017-11-12 MED ORDER — RISPERIDONE 1 MG PO TABS
1.0000 mg | ORAL_TABLET | Freq: Two times a day (BID) | ORAL | 3 refills | Status: DC
Start: 2017-11-12 — End: 2017-11-16

## 2017-11-12 NOTE — Telephone Encounter (Signed)
Risperdal refilled.   Tarri AbernethyAbigail J Denay Pleitez, MD, MPH PGY-3 [facility] Pager [phone]

## 2017-11-16 ENCOUNTER — Other Ambulatory Visit: Payer: Self-pay

## 2017-11-16 ENCOUNTER — Ambulatory Visit (INDEPENDENT_AMBULATORY_CARE_PROVIDER_SITE_OTHER): Payer: BLUE CROSS/BLUE SHIELD | Admitting: Internal Medicine

## 2017-11-16 ENCOUNTER — Encounter: Payer: Self-pay | Admitting: Internal Medicine

## 2017-11-16 VITALS — BP 100/62 | HR 80 | Temp 98.2°F | Ht 70.0 in | Wt 140.0 lb

## 2017-11-16 DIAGNOSIS — Z760 Encounter for issue of repeat prescription: Secondary | ICD-10-CM

## 2017-11-16 MED ORDER — RISPERIDONE 1 MG PO TABS: 1.0000 mg | ORAL_TABLET | Freq: Two times a day (BID) | ORAL | 3 refills | Status: DC

## 2017-11-16 NOTE — Progress Notes (Signed)
Note entered in error

## 2017-11-21 ENCOUNTER — Other Ambulatory Visit: Payer: Self-pay

## 2017-11-21 MED ORDER — LEVOTHYROXINE SODIUM 125 MCG PO TABS: ORAL_TABLET | ORAL | 3 refills | Status: DC

## 2017-11-21 NOTE — Telephone Encounter (Signed)
Patient wife called and left message on nurse line requesting refill on Levothyroxine.   Last OV: 11/16/2017 Next OV: none scheduled  Last fill: 07/30/17 #90 with 3 RF

## 2018-05-22 ENCOUNTER — Ambulatory Visit (INDEPENDENT_AMBULATORY_CARE_PROVIDER_SITE_OTHER): Payer: BLUE CROSS/BLUE SHIELD | Admitting: Family Medicine

## 2018-05-22 ENCOUNTER — Other Ambulatory Visit: Payer: Self-pay

## 2018-05-22 ENCOUNTER — Encounter: Payer: Self-pay | Admitting: Family Medicine

## 2018-05-22 DIAGNOSIS — M25562 Pain in left knee: Secondary | ICD-10-CM

## 2018-05-22 NOTE — Patient Instructions (Addendum)
Good to see you today!  Thanks for coming in.  Cut back on the ibuprfofen 1 tab up to four times a day with 2 tylenol tabs which do not cause swelling  As the pain improves gradually stop the ibuprofen  If the leg is not back to normal in 3 weeks then call us and will refer you to an ortho doctor  Come back to see Dr Karen ChafeLockamy in October 2019   If you would like to work smoking come back

## 2018-05-22 NOTE — Progress Notes (Signed)
Subjective  Joshua LusterDonald Mcknight is a 57 y.o. male is presenting with the following  JOINT PAIN Location: Left leg ankle, leg and knee.   Course:  About a month ago started with pain ankle - can't recall any reason.  Pain made him limp which caused his knee to hurt. Over last week has gotten better with ibuprofen Worse with:  Walking alot Better with:  rest Trauma: no Swelling:  His legs swell chronically but this has not worsened Locking:  no Other Joints involved:  no Rash: no Fever:  No PMH - had fracture of left knee years ago and has felt abnormal since   Chief Complaint noted Review of Symptoms - see HPI PMH - Smoking status noted.    Objective Vital Signs reviewed BP 98/62   Pulse 72   Temp 98.4 F (36.9 C) (Oral)   Ht 5\' 10"  (1.778 m)   Wt 153 lb 6.4 oz (69.6 kg)   SpO2 99%   BMI 22.01 kg/m  Able to walk on heels and toes and do moderate deep knee bend Legs - FROM - 2+ edema bilaterally with stasis dermatitis.  Old scar on left knee with FROM no edema and no focal tenderness or laxity.  Ankle and hip FROM no pain  Assessments/Plans  See after visit summary for details of patient instuctions  Knee pain, left Exacerbation of chronic condition now improving.  The ibuprofen may be causing more swelling so wean off this and take tylenol.  If pain does not resolve he would like a referral to ortho given his surgery years ago.

## 2018-05-22 NOTE — Assessment & Plan Note (Signed)
Exacerbation of chronic condition now improving.  The ibuprofen may be causing more swelling so wean off this and take tylenol.  If pain does not resolve he would like a referral to ortho given his surgery years ago.

## 2018-07-25 ENCOUNTER — Other Ambulatory Visit: Payer: Self-pay | Admitting: Internal Medicine

## 2018-09-13 ENCOUNTER — Other Ambulatory Visit: Payer: Self-pay | Admitting: Family Medicine

## 2018-09-13 ENCOUNTER — Telehealth: Payer: Self-pay

## 2018-09-13 NOTE — Telephone Encounter (Signed)
Called patient and left voice mail message to call and schedule an appointment with Dr. Karen ChafeLockamy.  Glennie Hawk.Simpson, Michelle R, CMA

## 2018-11-25 ENCOUNTER — Other Ambulatory Visit: Payer: Self-pay | Admitting: *Deleted

## 2018-11-25 MED ORDER — LEVOTHYROXINE SODIUM 125 MCG PO TABS: ORAL_TABLET | ORAL | 3 refills | Status: DC

## 2018-11-28 ENCOUNTER — Other Ambulatory Visit: Payer: Self-pay | Admitting: *Deleted

## 2018-11-28 ENCOUNTER — Other Ambulatory Visit: Payer: Self-pay

## 2018-11-28 MED ORDER — LEVOTHYROXINE SODIUM 125 MCG PO TABS: ORAL_TABLET | ORAL | 3 refills | Status: DC

## 2018-11-28 MED ORDER — RISPERIDONE 2 MG PO TABS: 1.0000 mg | ORAL_TABLET | Freq: Two times a day (BID) | ORAL | 1 refills | Status: DC

## 2018-11-28 NOTE — Telephone Encounter (Signed)
Re-sent to different pharmacy at patient request. Ples SpecterAlisa Brake, RN [facility])

## 2018-12-03 MED ORDER — RISPERIDONE 2 MG PO TABS: 1.0000 mg | ORAL_TABLET | Freq: Two times a day (BID) | ORAL | 0 refills | Status: DC

## 2018-12-03 NOTE — Addendum Note (Signed)
Addended by: Jone Baseman D on: 12/03/2018 03:42 PM   Modules accepted: Orders

## 2018-12-03 NOTE — Telephone Encounter (Signed)
Pt came in today and states that his insurance only pays for a 3 month supply of risperidone.  Verify with pharmacy.   Called in 3 month supply and scheduled for a physical 12/10/18. Fleeger, Maryjo Rochester, CMA

## 2018-12-10 ENCOUNTER — Encounter: Payer: Self-pay | Admitting: Family Medicine

## 2018-12-10 ENCOUNTER — Other Ambulatory Visit: Payer: Self-pay

## 2018-12-10 ENCOUNTER — Ambulatory Visit (INDEPENDENT_AMBULATORY_CARE_PROVIDER_SITE_OTHER): Payer: BLUE CROSS/BLUE SHIELD | Admitting: Family Medicine

## 2018-12-10 VITALS — BP 100/60 | HR 65 | Temp 97.4°F | Ht 70.0 in | Wt 153.0 lb

## 2018-12-10 DIAGNOSIS — Z791 Long term (current) use of non-steroidal anti-inflammatories (NSAID): Secondary | ICD-10-CM

## 2018-12-10 DIAGNOSIS — J449 Chronic obstructive pulmonary disease, unspecified: Secondary | ICD-10-CM

## 2018-12-10 DIAGNOSIS — E7800 Pure hypercholesterolemia, unspecified: Secondary | ICD-10-CM

## 2018-12-10 DIAGNOSIS — E039 Hypothyroidism, unspecified: Secondary | ICD-10-CM | POA: Diagnosis not present

## 2018-12-10 DIAGNOSIS — F22 Delusional disorders: Secondary | ICD-10-CM

## 2018-12-10 NOTE — Patient Instructions (Signed)
It was great to meet you today! Thank you for letting me participate in your care!  Today, we discussed checking routine lab work for your cholesterol, kidney function, thyroid function, and liver function as well as electrolytes. I will also check your blood counts to ensure they are all normal. If anything is abnormal I will call you.  Smoking cessation is the most important change you can make to positively impact your health. Please let me know if I can ever help you quit smoking as I would be glad to offer any assistance when you are ready.  Please schedule an appointment with Dr. Raymondo BandKoval to have your lung function checked before you leave today.  Be well, Jules Schickim Rashanna Christiana, DO PGY-2, [facility]

## 2018-12-10 NOTE — Progress Notes (Signed)
Subjective: Chief Complaint  Patient presents with  . Annual Exam    HPI: Joshua Mcknight is a 58 y.o. presenting to clinic today to discuss the following:  Annual Physical Exam Joshua Mcknight is a 57y/o male with history of acute paranoia, COPD, HLD, and hypothyroidism presenting today for his annual exam. He states he gets short of breath on exertion but states that it is the same as it has been for the past several years. Otherwise, he has no complaints.  HLD No currently taking medications, states "diet is ok". No chest pain or pressure on exertion, no signs of CAD however is a smoker.  Hypothyroid Stable. Currently compliant with taking medications; denies any symptoms or side effects from medication.  Health Maintenance: declines colonoscopy    ROS noted in HPI.   Past Medical, Surgical, Social, and Family History Reviewed & Updated per EMR.   Pertinent Historical Findings include:   Social History   Tobacco Use  Smoking Status Current Every Day Smoker  . Packs/day: 1.00  . Years: 45.00  . Pack years: 45.00  . Types: Cigarettes  Smokeless Tobacco Never Used    Objective: BP 100/60   Pulse 65   Temp (!) 97.4 F (36.3 C) (Oral)   Ht 5\' 10"  (1.778 m)   Wt 153 lb (69.4 kg)   SpO2 96%   BMI 21.95 kg/m  Vitals and nursing notes reviewed  Physical Exam Gen: Alert and Oriented x 3, NAD HEENT: Normocephalic, atraumatic, PERRLA, EOMI, TM visible with good light reflex, non-swollen, non-erythematous turbinates, non-erythematous pharyngeal mucosa, no exudates Neck: trachea midline, no thyroidmegaly, no LAD CV: RRR, no murmurs, normal S1, S2 split Resp: CTAB, no wheezing, rales, or rhonchi, comfortable work of breathing Abd: non-distended, non-tender, soft, +bs in all four quadrants MSK: Moves all four extremities Ext: no clubbing, cyanosis, or edema Neuro: No gross deficits Skin: warm, dry, intact, no rashes  Results for orders placed or performed in visit on  12/10/18 (from the past 72 hour(s))  TSH + free T4     Status: None   Collection Time: 12/10/18  9:11 AM  Result Value Ref Range   TSH 0.951 0.450 - 4.500 uIU/mL   Free T4 1.42 0.82 - 1.77 ng/dL  Lipid Panel     Status: Abnormal   Collection Time: 12/10/18  9:11 AM  Result Value Ref Range   Cholesterol, Total 273 (H) 100 - 199 mg/dL   Triglycerides 409 (H) 0 - 149 mg/dL   HDL 44 >81 mg/dL   VLDL Cholesterol Cal 35 5 - 40 mg/dL   LDL Calculated 191 (H) 0 - 99 mg/dL   Chol/HDL Ratio 6.2 (H) 0.0 - 5.0 ratio    Comment:                                   T. Chol/HDL Ratio                                             Men  Women                               1/2 Avg.Risk  3.4    3.3  Avg.Risk  5.0    4.4                                2X Avg.Risk  9.6    7.1                                3X Avg.Risk 23.4   11.0   CBC with Differential     Status: None (Preliminary result)   Collection Time: 12/10/18  9:11 AM  Result Value Ref Range   WBC WILL FOLLOW    RBC 4.80 4.14 - 5.80 x10E6/uL   Hemoglobin WILL FOLLOW    Hematocrit WILL FOLLOW    MCV 86 79 - 97 fL   MCH WILL FOLLOW    MCHC WILL FOLLOW    RDW WILL FOLLOW    Platelets 260 150 - 450 x10E3/uL   Neutrophils WILL FOLLOW    Lymphs 30 Not Estab. %   Monocytes 8 Not Estab. %   Eos 2 Not Estab. %   Basos 1 Not Estab. %   Neutrophils Absolute 4.3 1.4 - 7.0 x10E3/uL   Lymphocytes Absolute 2.2 0.7 - 3.1 x10E3/uL   Monocytes Absolute 0.6 0.1 - 0.9 x10E3/uL   EOS (ABSOLUTE) 0.1 0.0 - 0.4 x10E3/uL   Basophils Absolute 0.1 0.0 - 0.2 x10E3/uL   Immature Granulocytes 0 Not Estab. %   Immature Grans (Abs) 0.0 0.0 - 0.1 x10E3/uL  Comprehensive metabolic panel     Status: Abnormal   Collection Time: 12/10/18  9:11 AM  Result Value Ref Range   Glucose 87 65 - 99 mg/dL   BUN 5 (L) 6 - 24 mg/dL   Creatinine, Ser 7.820.83 0.76 - 1.27 mg/dL   GFR calc non Af Amer 97 >59 mL/min/1.73   GFR calc Af Amer 112 >59  mL/min/1.73   BUN/Creatinine Ratio 6 (L) 9 - 20   Sodium 140 134 - 144 mmol/L   Potassium 4.3 3.5 - 5.2 mmol/L   Chloride 100 96 - 106 mmol/L   CO2 19 (L) 20 - 29 mmol/L   Calcium 9.2 8.7 - 10.2 mg/dL   Total Protein 6.7 6.0 - 8.5 g/dL   Albumin 4.4 3.8 - 4.9 g/dL    Comment:               **Please note reference interval change**   Globulin, Total 2.3 1.5 - 4.5 g/dL   Albumin/Globulin Ratio 1.9 1.2 - 2.2   Bilirubin Total 0.8 0.0 - 1.2 mg/dL   Alkaline Phosphatase 88 39 - 117 IU/L   AST 21 0 - 40 IU/L   ALT 15 0 - 44 IU/L    Assessment/Plan:  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 - PFT with Dr. Raymondo BandKoval and hopefully patient will agree to controller medication if he still meet criteria. Currently, not taking any medication for COPD - Encouraged smoking cessation however patient not ready to quit at this time.  Hypothyroidism On Synthroid 125mcg. Stable with no symptoms or complaints. - Thyroid panel today, will adjust dose if necessary  HYPERCHOLESTEROLEMIA Not currently on any medication with history of high LDL and total cholesterol. Given his latest LDL is over 190 current best evidence supports a high intensity statin. - will call patient and notify him that I recommend  he start high intensity atorvastatin 40mg  - Repeat lipid panel in 6 months to 1 year to ensure medication dose benefit   PATIENT EDUCATION PROVIDED: See AVS    Diagnosis and plan along with any newly prescribed medication(s) were discussed in detail with this patient today. The patient verbalized understanding and agreed with the plan. Patient advised if symptoms worsen return to clinic or ER.    Orders Placed This Encounter  Procedures  . TSH + free T4  . Lipid Panel    Order Specific Question:   Has the patient fasted?    Answer:   No  . CBC with Differential  . Comprehensive metabolic panel    Order  Specific Question:   Has the patient fasted?    Answer:   No  . Pulmonary function test    Standing Status:   Future    Standing Expiration Date:   06/10/2019    Order Specific Question:   Where should this test be performed?    Answer:   Other    Order Specific Question:   Full PFT: includes the following: basic spirometry, spirometry pre & post bronchodilator, diffusion capacity (DLCO), lung volumes    Answer:   Full PFT     Jules Schickim Markeshia Giebel, DO 12/10/2018, 8:35 AM PGY-2 [facility]

## 2018-12-12 ENCOUNTER — Other Ambulatory Visit: Payer: Self-pay | Admitting: Family Medicine

## 2018-12-12 LAB — COMPREHENSIVE METABOLIC PANEL
ALBUMIN: 4.4 g/dL (ref 3.8–4.9)
ALK PHOS: 88 IU/L (ref 39–117)
ALT: 15 IU/L (ref 0–44)
AST: 21 IU/L (ref 0–40)
Albumin/Globulin Ratio: 1.9 (ref 1.2–2.2)
BILIRUBIN TOTAL: 0.8 mg/dL (ref 0.0–1.2)
BUN / CREAT RATIO: 6 — AB (ref 9–20)
BUN: 5 mg/dL — ABNORMAL LOW (ref 6–24)
CHLORIDE: 100 mmol/L (ref 96–106)
CO2: 19 mmol/L — ABNORMAL LOW (ref 20–29)
Calcium: 9.2 mg/dL (ref 8.7–10.2)
Creatinine, Ser: 0.83 mg/dL (ref 0.76–1.27)
GFR calc non Af Amer: 97 mL/min/{1.73_m2} (ref >59)
GFR, EST AFRICAN AMERICAN: 112 mL/min/{1.73_m2} (ref >59)
Globulin, Total: 2.3 g/dL (ref 1.5–4.5)
Glucose: 87 mg/dL (ref 65–99)
POTASSIUM: 4.3 mmol/L (ref 3.5–5.2)
Sodium: 140 mmol/L (ref 134–144)
TOTAL PROTEIN: 6.7 g/dL (ref 6.0–8.5)

## 2018-12-12 LAB — CBC WITH DIFFERENTIAL/PLATELET
BASOS: 1 %
Basophils Absolute: 0.1 10*3/uL (ref 0.0–0.2)
EOS (ABSOLUTE): 0.1 10*3/uL (ref 0.0–0.4)
Eos: 2 %
HEMOGLOBIN: 14.1 g/dL (ref 13.0–17.7)
Hematocrit: 41.2 % (ref 37.5–51.0)
IMMATURE GRANS (ABS): 0 10*3/uL (ref 0.0–0.1)
Immature Granulocytes: 0 %
LYMPHS ABS: 2.2 10*3/uL (ref 0.7–3.1)
LYMPHS: 30 %
MCH: 29.4 pg (ref 26.6–33.0)
MCHC: 34.2 g/dL (ref 31.5–35.7)
MCV: 86 fL (ref 79–97)
MONOCYTES: 8 %
Monocytes Absolute: 0.6 10*3/uL (ref 0.1–0.9)
Neutrophils Absolute: 4.3 10*3/uL (ref 1.4–7.0)
Neutrophils: 59 %
Platelets: 260 10*3/uL (ref 150–450)
RBC: 4.8 x10E6/uL (ref 4.14–5.80)
RDW: 11.9 % (ref 11.6–15.4)
WBC: 7.2 10*3/uL (ref 3.4–10.8)

## 2018-12-12 LAB — LIPID PANEL
CHOL/HDL RATIO: 6.2 ratio — AB (ref 0.0–5.0)
CHOLESTEROL TOTAL: 273 mg/dL — AB (ref 100–199)
HDL: 44 mg/dL (ref >39)
LDL CALC: 194 mg/dL — AB (ref 0–99)
Triglycerides: 173 mg/dL — ABNORMAL HIGH (ref 0–149)
VLDL Cholesterol Cal: 35 mg/dL (ref 5–40)

## 2018-12-12 LAB — TSH+FREE T4
Free T4: 1.42 ng/dL (ref 0.82–1.77)
TSH: 0.951 u[IU]/mL (ref 0.450–4.500)

## 2018-12-12 MED ORDER — ATORVASTATIN CALCIUM 40 MG PO TABS: 40.0000 mg | ORAL_TABLET | Freq: Every day | ORAL | 3 refills | Status: DC

## 2018-12-12 NOTE — Assessment & Plan Note (Addendum)
Patient still smoking but does endorse worsening SOB with increased exertion. - PFT with Dr. Raymondo BandKoval and hopefully patient will agree to controller medication if he still meet criteria. Currently, not taking any medication for COPD - Encouraged smoking cessation however patient not ready to quit at this time.

## 2018-12-12 NOTE — Progress Notes (Signed)
LDL over 190. Statin indicated as risk will be over 10% with LDL over 190.

## 2018-12-12 NOTE — Assessment & Plan Note (Signed)
On Synthroid . Stable with no symptoms or complaints. - Thyroid panel today, will adjust dose if necessary

## 2018-12-12 NOTE — Assessment & Plan Note (Signed)
Not currently on any medication with history of high LDL and total cholesterol. Given his latest LDL is over 190 current best evidence supports a high intensity statin. - will call patient and notify him that I recommend he start high intensity atorvastatin 40mg  - Repeat lipid panel in 6 months to 1 year to ensure medication dose benefit

## 2018-12-16 ENCOUNTER — Encounter: Payer: Self-pay | Admitting: Family Medicine

## 2019-01-03 ENCOUNTER — Encounter (HOSPITAL_COMMUNITY): Payer: Self-pay | Admitting: Emergency Medicine

## 2019-01-03 ENCOUNTER — Ambulatory Visit (HOSPITAL_COMMUNITY)
Admission: EM | Admit: 2019-01-03 | Discharge: 2019-01-03 | Disposition: A | Discharge status: Home / Self Care | Payer: BLUE CROSS/BLUE SHIELD | Attending: Family Medicine | Admitting: Family Medicine

## 2019-01-03 DIAGNOSIS — I83018 Varicose veins of right lower extremity with ulcer other part of lower leg: Secondary | ICD-10-CM | POA: Diagnosis not present

## 2019-01-03 DIAGNOSIS — L97811 Non-pressure chronic ulcer of other part of right lower leg limited to breakdown of skin: Secondary | ICD-10-CM | POA: Diagnosis not present

## 2019-01-03 DIAGNOSIS — L03115 Cellulitis of right lower limb: Secondary | ICD-10-CM

## 2019-01-03 MED ORDER — BACITRACIN ZINC 500 UNIT/GM EX OINT: 1.0000 "application " | TOPICAL_OINTMENT | Freq: Two times a day (BID) | CUTANEOUS | 0 refills | Status: DC

## 2019-01-03 MED ORDER — SULFAMETHOXAZOLE-TRIMETHOPRIM 800-160 MG PO TABS: 1.0000 | ORAL_TABLET | Freq: Two times a day (BID) | ORAL | 0 refills | Status: AC

## 2019-01-03 NOTE — ED Provider Notes (Signed)
[facility]    CSN: [id number] Arrival date & time: 01/03/19  1206     History   Chief Complaint Chief Complaint  Patient presents with  . Wound Check    HPI Joshua Mcknight is a 58 y.o. male.   HPI   Patient states he has had a history of ulcers on his legs that are difficult to heal.  He sometimes gets swelling.  His skin is dry.  He will then get swollen ankles, skin breakdown, and ulcers.  His current skin breakdown is been present for 2 to 3 weeks.  Now the wounds are starting to leak.  The skin is turning red and is more painful.  He thinks he may have an infection.  He has been trying to elevate his legs more.  It is in his right leg this time, although he states he is sometimes had problems with his left. He does not think he has trouble with his circulation although he has obvious varicose veins. No fever or chills  Past Medical History:  Diagnosis Date  . Emphysema of lung (HCC)   . Hyperlipidemia   . Thyroid disease     Patient Active Problem List   Diagnosis Date Noted  . Knee pain, left 05/22/2018  . 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 Acute paranoia (HCC) 09/08/2015  . Elevated coronary artery calcium score 02/04/2014  . Poor dentition 05/15/2013  . NSAID long-term use 12/05/2012  . TINNITUS, CHRONIC, BILATERAL 02/12/2009  . Hypothyroidism 08/28/2007  . HYPERCHOLESTEROLEMIA 08/28/2007  . Cigarette smoker 08/28/2007    Past Surgical History:  Procedure Laterality Date  . KNEE SURGERY Left age 66       Home Medications    Prior to Admission medications   Medication Sig Start Date End Date Taking? Authorizing Provider  atorvastatin (LIPITOR) 40 MG tablet Take 1 tablet (40 mg total) by mouth daily. 12/12/18   Arlyce Harman, DO  bacitracin ointment Apply 1 application topically 2 (two) times daily. 01/03/19   Eustace Moore, MD  levothyroxine (SYNTHROID, LEVOTHROID) 125 MCG tablet take 1 tablet by mouth  once daily BEFORE BREAKFAST 11/28/18   Hensel, Santiago Bumpers, MD  risperiDONE (RISPERDAL) 2 MG tablet Take 0.5 tablets (1 mg total) by mouth 2 (two) times daily. 12/03/18   Arlyce Harman, DO  sulfamethoxazole-trimethoprim (BACTRIM DS,SEPTRA DS) 800-160 MG tablet Take 1 tablet by mouth 2 (two) times daily for 7 days. 01/03/19 01/10/19  Eustace Moore, MD    Family History Family History  Problem Relation Age of Onset  . CAD Father        CABG at 10  . Aneurysm Father   . Hyperlipidemia Mother   . Hypertension Mother   . Edema Mother   . Other Brother        artery blockage  . Bronchitis Brother   . Diabetes Mellitus I Brother     Social History Social History   Tobacco Use  . Smoking status: Current Every Day Smoker    Packs/day: 1.00    Years: 45.00    Pack years: 45.00    Types: Cigarettes  . Smokeless tobacco: Never Used  Substance Use Topics  . Alcohol use: No    Alcohol/week: 0.0 standard drinks  . Drug use: No     Allergies   Food   Review of Systems Review of Systems  Constitutional: Negative for chills and fever.  HENT: Negative for ear pain  and sore throat.   Eyes: Negative for pain and visual disturbance.  Respiratory: Negative for cough and shortness of breath.   Cardiovascular: Positive for leg swelling. Negative for chest pain and palpitations.  Gastrointestinal: Negative for abdominal pain and vomiting.  Genitourinary: Negative for dysuria and hematuria.  Musculoskeletal: Negative for arthralgias and back pain.  Skin: Positive for wound. Negative for color change and rash.  Neurological: Negative for seizures and syncope.  All other systems reviewed and are negative.    Physical Exam Triage Vital Signs ED Triage Vitals [01/03/19 1222]  Enc Vitals Group     BP 121/76     Pulse Rate 74     Resp 18     Temp 98.3 F (36.8 C)     Temp Source Oral     SpO2 98 %     Weight      Height      Head Circumference      Peak Flow      Pain Score 0       Pain Loc      Pain Edu?      Excl. in GC?    No data found.  Updated Vital Signs BP 121/76 (BP Location: Left Arm)   Pulse 74   Temp 98.3 F (36.8 C) (Oral)   Resp 18   SpO2 98%      Physical Exam Constitutional:      General: He is not in acute distress.    Appearance: He is well-developed.  HENT:     Head: Normocephalic and atraumatic.  Eyes:     Conjunctiva/sclera: Conjunctivae normal.     Pupils: Pupils are equal, round, and reactive to light.  Neck:     Musculoskeletal: Normal range of motion.  Cardiovascular:     Rate and Rhythm: Normal rate and regular rhythm.     Pulses: Normal pulses.     Heart sounds: Normal heart sounds.     Comments: Pedal pulses are palpable bilateral.  Venous stasis disease obvious on the right with multiple small varicosities Pulmonary:     Effort: Pulmonary effort is normal. No respiratory distress.     Breath sounds: Normal breath sounds.  Abdominal:     General: There is no distension.     Palpations: Abdomen is soft.  Musculoskeletal: Normal range of motion.     Right lower leg: Edema present.  Skin:    General: Skin is warm and dry.     Comments: Right lower extremity is swollen.  There are a number of blisters that have scabbed over.  They all appear to be superficial.  There is some erythema and tenderness surrounding the blisters.  The entire area of wound, blisters, redness is large on the front of the shin, approximately 8 x 10 cm.  The remaining skin on the shin is dry.  No new blisters forming.  Neurological:     Mental Status: He is alert.      UC Treatments / Results  Labs (all labs ordered are listed, but only abnormal results are displayed) Labs Reviewed - No data to display  EKG None  Radiology No results found.  Procedures Procedures (including critical care time)  Medications Ordered in UC Medications - No data to display  Initial Impression / Assessment and Plan / UC Course  I have reviewed the  triage vital signs and the nursing notes.  Pertinent labs & imaging results that were available during my care of the patient  were reviewed by me and considered in my medical decision making (see chart for details).     I told the patient he has venous stasis disease that is causing the edema.  When the edema gets out of control, his dry skin is easily broken down and can get blistered.  He is prevention.  He needs to discuss this with his PCP.  For now he needs to elevate the leg to get the swelling out, and treat the wound. Final Clinical Impressions(s) / UC Diagnoses   Final diagnoses:  Venous stasis ulcer of other part of right lower leg limited to breakdown of skin with varicose veins (HCC)  Cellulitis of leg, right     Discharge Instructions     Reduce the salt in your diet Elevate your leg to reduce the swelling Wash leg twice a day, apply antibiotic ointment, and wrap Take antibiotic 2 times a day Follow-up with your primary care doctor next week  Your last tetanus shot was 2013.  Good for 10 years   ED Prescriptions    Medication Sig Dispense Auth. Provider   sulfamethoxazole-trimethoprim (BACTRIM DS,SEPTRA DS) 800-160 MG tablet Take 1 tablet by mouth 2 (two) times daily for 7 days. 14 tablet Eustace Moore, MD   bacitracin ointment Apply 1 application topically 2 (two) times daily. 28 g Eustace Moore, MD     Controlled Substance Prescriptions [place] Controlled Substance Registry consulted? Not Applicable   Eustace Moore, MD 01/03/19 1324

## 2019-01-03 NOTE — ED Triage Notes (Signed)
Pt here for non healing wound to right leg; pt sts hx of same; pt sts this wound has been present x 3 weeks

## 2019-01-03 NOTE — Discharge Instructions (Addendum)
Reduce the salt in your diet Elevate your leg to reduce the swelling Wash leg twice a day, apply antibiotic ointment, and wrap Take antibiotic 2 times a day Follow-up with your primary care doctor next week  Your last tetanus shot was 2013.  Good for 10 years

## 2019-01-10 ENCOUNTER — Other Ambulatory Visit: Payer: Self-pay

## 2019-01-10 ENCOUNTER — Encounter: Payer: Self-pay | Admitting: Pharmacist

## 2019-01-10 ENCOUNTER — Ambulatory Visit (INDEPENDENT_AMBULATORY_CARE_PROVIDER_SITE_OTHER): Payer: BLUE CROSS/BLUE SHIELD | Admitting: Pharmacist

## 2019-01-10 DIAGNOSIS — E7800 Pure hypercholesterolemia, unspecified: Secondary | ICD-10-CM

## 2019-01-10 DIAGNOSIS — J449 Chronic obstructive pulmonary disease, unspecified: Secondary | ICD-10-CM

## 2019-01-10 DIAGNOSIS — F17210 Nicotine dependence, cigarettes, uncomplicated: Secondary | ICD-10-CM

## 2019-01-10 MED ORDER — TIOTROPIUM BROMIDE MONOHYDRATE 1.25 MCG/ACT IN AERS: 2.0000 | INHALATION_SPRAY | Freq: Every day | RESPIRATORY_TRACT | 11 refills | Status: DC

## 2019-01-10 NOTE — Progress Notes (Signed)
1/2 ppd 35-40 years smoke history 2-3 years since last quit Patches - 21 mg, chantix

## 2019-01-10 NOTE — Assessment & Plan Note (Addendum)
History of Emphysema/COPD.  Experiencing shortness of breath on exertion due to COPD secondary to  long-term tobacco abuse. He is not currently on any breathing medications. Spirometry evaluation reveals moderate obstruction. Patient did not want to pursue albuterol treatment for a post-test due to having a history with chest tightness/anxiety when taking albuterol in the past.  -begin Spiriva Respimat one inhalation once daily (can go up to 2 inhalations once daily if necessary) -Educated patient on purpose, proper use, potential adverse effects including dry mouth. -Reviewed results of pulmonary function tests.  Pt verbalized understanding of results and education.

## 2019-01-10 NOTE — Assessment & Plan Note (Signed)
Elevated LDL> 190. Pt has been taking Lipitor for ~ 1 month with no intolerance reported. Pt was instructed to continue current regimen until F/U visit to assess if further changes are necessary.  Consider treatment escalation following next Lipid Panel.  Future orders for Lipid Panel (for use prior to PCP visit) placed today

## 2019-01-10 NOTE — Patient Instructions (Addendum)
Great to meet you today.   Lung function test showed Moderate Obstruction. Please continue Lipitor once daily   Good luck with cutting back or quitting smoking.  Please start Spiriva Respimat - Take one inhalation once daily.    Return to see Dr. Karen Chafe in 1-2 months.

## 2019-01-10 NOTE — Progress Notes (Signed)
Patient ID: Joshua Mcknight, male   DOB: [birth year], 58 y.o.   MRN: [id number] Reviewed: Agree with Dr. Macky Lower documentation and management.

## 2019-01-10 NOTE — Progress Notes (Signed)
   S:    Patient arrives in good spirits and ambulating without assistance.  Presents for lung function evaluation.   Patient was referred by Dr. Karen Chafe during last PCP visit on 12/10/18. He states he has a history of emphysema and has about a 40 year smoking history. He has tried quitting in the past with nicotine patches and Chantix but reports the patches worked very well for him.  Patient reports breathing has been problematic. He says he gets out of breath very easily and limits his daily activities. He does yard work when he has to but says it has become more difficult.    Patient reports adherence to medications Current COPD medications: none Patient exacerbation hx: none  O: Physical Exam Vitals signs reviewed.  Neurological:     Mental Status: He is alert.    Review of Systems  Respiratory: Positive for shortness of breath (on exertion).   All other systems reviewed and are negative.    Vitals:   01/10/19 0906  BP: 110/62  Pulse: 74  SpO2: 98%    CAT score= 10 See Documentation Flowsheet - CAT/COPD for complete symptom scoring.  See "scanned report" or Documentation Flowsheet (discrete results - PFTs) for Spirometry results. Patient provided good effort while attempting spirometry.   Lung Age = 36  A/P: History of Emphysema/COPD.  Experiencing shortness of breath on exertion due to COPD secondary to  long-term tobacco abuse. He is not currently on any breathing medications. Spirometry evaluation reveals moderate obstruction. Patient did not want to pursue albuterol treatment for a post-test due to having a history with chest tightness/anxiety when taking albuterol in the past.  -begin Spiriva Respimat one inhalation once daily (can go up to 2 inhalations once daily if necessary) -Educated patient on purpose, proper use, potential adverse effects including dry mouth. -Reviewed results of pulmonary function tests.  Pt verbalized understanding of results and education.     Chronic tobacco abuse, previous 2 ppd for 2 years and then 1 ppd for many years. Most recently 10 cigarettes per day.  Contemplative during visit. After reviewing lung age of 58 from Spirometry today. Encouraged cessation in the near future.  Patient was receptive to possible quit attempt in the near future. Suggest patch strength of 7 or 14mg  if used in the future.     Elevated LDL> 190. Pt has been taking Lipitor for ~ 1 month with no intolerance reported. Pt was instructed to continue current regimen until F/U visit to assess if further changes are necessary.  Consider treatment escalation following next Lipid Panel.  Future orders for Lipid Panel (for use prior to PCP visit) placed today.   Written pt instructions provided.  F/U Clinic visit with Dr. Karen Chafe in 1-2 months.     Total time in face to face counseling 39 minutes.  Patient seen with Wilhemina Bonito PharmD Candidate, Danae Orleans, PharmD, PGY-1 resident. and Madelon Lips, PharmD.

## 2019-01-10 NOTE — Assessment & Plan Note (Signed)
Chronic tobacco abuse, previous 2 ppd for 2 years and then 1 ppd for many years. Most recently 10 cigarettes per day.  Contemplative during visit. After reviewing lung age of 50 from Spirometry today. Encouraged cessation in the near future.  Patient was receptive to possible quit attempt in the near future. Suggest patch strength of 7 or 14mg  if used in the future.

## 2019-01-27 ENCOUNTER — Encounter: Payer: Self-pay | Admitting: Family Medicine

## 2019-01-29 ENCOUNTER — Encounter: Payer: Self-pay | Admitting: Family Medicine

## 2019-01-30 ENCOUNTER — Other Ambulatory Visit: Payer: Self-pay | Admitting: Family Medicine

## 2019-01-30 MED ORDER — UMECLIDINIUM BROMIDE 62.5 MCG/INH IN AEPB: 1.0000 | INHALATION_SPRAY | Freq: Every day | RESPIRATORY_TRACT | 2 refills | Status: AC

## 2019-01-30 NOTE — Progress Notes (Unsigned)
Spiriva not covered by insurance. Will send in prescription for Incruse

## 2019-02-06 ENCOUNTER — Encounter: Payer: Self-pay | Admitting: Family Medicine

## 2019-02-15 ENCOUNTER — Encounter: Payer: Self-pay | Admitting: Family Medicine

## 2019-02-17 ENCOUNTER — Other Ambulatory Visit: Payer: Self-pay | Admitting: Family Medicine

## 2019-02-17 MED ORDER — IPRATROPIUM BROMIDE HFA 17 MCG/ACT IN AERS: 2.0000 | INHALATION_SPRAY | Freq: Four times a day (QID) | RESPIRATORY_TRACT | 12 refills | Status: DC | PRN

## 2019-02-17 NOTE — Progress Notes (Signed)
Changing to Ipratropium due to insurance co-pay is lower.

## 2019-02-19 ENCOUNTER — Telehealth: Payer: Self-pay | Admitting: *Deleted

## 2019-02-19 NOTE — Telephone Encounter (Signed)
Spoke with Catie Feliz [facility]) nasal spray is not acceptable.   Still unable to locate formulary.   I have LMOVM for pt to return call.  Will ask if there is a different number on his pharmacy card that I may call.  I attempted to call the one on back of card that is scanned in.  Message from [place]: Kathrin Ruddy, [facility]  Lourena Simmonds, [place]; , Princella Pellegrini, CMA        Agree with LABA LAMA options outlines by Catie. I perfer not to use Atrovent Respimat for most lung disease patients. If we can identify formulary coverage, Anoro is 1 inhalation daily, Stiolto is 2 inhalation doses daily (Respimat).   Hope that helps.   PK

## 2019-02-19 NOTE — Telephone Encounter (Signed)
Per pharmacy if we change to ipratropium NASAL SPRAY it will only cost pt $6.  Will forward to MD to change if appropriate. Jone Baseman, CMA

## 2019-02-20 NOTE — Telephone Encounter (Signed)
Discussed and reviewed plan with Joshua Mcknight while in Pharmacy Clinic this AM.

## 2019-02-20 NOTE — Telephone Encounter (Signed)
Contacted pharmacy, retrieved patient's [employer] insurance plan information:  BIN [id number] PCN ADV ID [id number] [phone]  Representative noted that patient has a deductible. She noted that his copay for inhalers will be ~20% of the drug cost. She confirmed that Spiriva Respimat is not on formulary, but Incruse is covered for a current cost of $253.23, total drug cost $404. Therefore, copay after deductible has been met may be around $80, is my guess.  There is not a coupon card noted for Incruse on the [employer] website, but I called the [place] help desk and there is a coupon card available. Signed patient up for a card:  BIN [id number] PCN LOYALTY KE group [id number] ID [id number]  [place] with this information. This brought the Incruse copay down to $10.  Contacted patient. Left message with this information.

## 2019-02-24 NOTE — Telephone Encounter (Signed)
Noted and agree. 

## 2019-02-26 ENCOUNTER — Other Ambulatory Visit: Payer: Self-pay | Admitting: *Deleted

## 2019-02-27 MED ORDER — RISPERIDONE 2 MG PO TABS: 1.0000 mg | ORAL_TABLET | Freq: Two times a day (BID) | ORAL | 0 refills | Status: DC

## 2019-09-04 ENCOUNTER — Other Ambulatory Visit: Payer: Self-pay

## 2019-09-05 MED ORDER — RISPERIDONE 2 MG PO TABS: 1.0000 mg | ORAL_TABLET | Freq: Two times a day (BID) | ORAL | 0 refills | Status: DC

## 2019-10-15 ENCOUNTER — Ambulatory Visit (INDEPENDENT_AMBULATORY_CARE_PROVIDER_SITE_OTHER): Payer: BC Managed Care – PPO | Admitting: Family Medicine

## 2019-10-15 ENCOUNTER — Other Ambulatory Visit: Payer: Self-pay

## 2019-10-15 VITALS — BP 122/78 | HR 70 | Wt 161.8 lb

## 2019-10-15 DIAGNOSIS — I872 Venous insufficiency (chronic) (peripheral): Secondary | ICD-10-CM

## 2019-10-15 NOTE — Patient Instructions (Signed)
It was a pleasure to see you today! Thank you for choosing [facility] for your primary care. Joshua Mcknight was seen for concern for leg. Come back to the clinic if anything gets worse.  Today we evaluated your leg, it does not look like you have an active infection at this time.  You do have some venous insufficiency which is a known issue and is probably the cause of these leg wounds that you have.  They will likely heal better if you can keep the getting so dry, you can use significant amounts of Vaseline twice per day.  I understand there is a concerned about your socks sticking to this she can either use a large enough amount of Vaseline to create a barrier or try nonstick gauze which might be more expensive.  If you develop a fever or significant swelling or warmth or pain starts to develop please reach out to [place] immediately.    Please bring all your medications to every doctors visit   Sign up for My Chart to have easy access to your labs results, and communication with your Primary care physician.     Please check-out at the front desk before leaving the clinic.     Best,  Dr. Sherene Sires FAMILY MEDICINE RESIDENT - PGY3 10/15/2019 3:47 PM

## 2019-10-18 DIAGNOSIS — I872 Venous insufficiency (chronic) (peripheral): Secondary | ICD-10-CM | POA: Insufficient documentation

## 2019-10-18 NOTE — Progress Notes (Signed)
    Subjective:  Joshua Mcknight is a 58 y.o. male who presents to the [facility] today with a chief complaint of leg wounds chronic.   HPI: Patient with known vascular insufficiency confirmed by imaging, states he had largely gotten his chronic leg wounds to state of healing until he got itchy few weeks ago and he started scratching them.  Since there there have been scabs which he says have remained dry but have had trouble healing.  Occasionally they will stick to his socks if they get moist which is why he has stopped using Vaseline.   He denies any purulence or drainage, denies any change in sensation distally.  Says he still able to walk without pain  Objective:  Physical Exam: BP 122/78   Pulse 70   Wt 161 lb 12.8 oz (73.4 kg)   SpO2 99%   BMI 23.89 kg/m   Gen: NAD, pleasant  CV: RRR with no murmurs appreciated Pulm: NWOB, CTAB with no crackles, wheezes, or rhonchi GI: Normal bowel sounds present. Soft, Nontender, Nondistended. MSK: no edema, cyanosis, or clubbing noted, distal motion and sensation still intact Skin: Multiple dry scabs approximately 1 to 3 inches in diameter distributed around lower legs bilaterally, particularly on anterior tibia.  Skin changes consistent with venous stasis with minor scaling, there is some redness more consistent with venous stasis than cellulitis.  No purulence or obvious pockets of accumulation of infection concerning for abscess. Neuro: grossly normal, moves all extremities Psych: Normal affect and thought content  No results found for this or any previous visit (from the past 72 hour(s)).   Assessment/Plan:  Venous stasis dermatitis of both lower extremities No indication of cellulitis requiring antibiotics, advised to keep moist with Vaseline.  May either use amounts of Vaseline significant enough to avoid sticking to socks or can use the more expensive option of nonstick gauze all the way advised Vaseline.  Return precautions given   Sherene Sires, [place] - PGY3 10/18/2019 9:25 AM

## 2019-10-18 NOTE — Assessment & Plan Note (Signed)
No indication of cellulitis requiring antibiotics, advised to keep moist with Vaseline.  May either use amounts of Vaseline significant enough to avoid sticking to socks or can use the more expensive option of nonstick gauze all the way advised Vaseline.  Return precautions given

## 2019-11-25 ENCOUNTER — Encounter (HOSPITAL_COMMUNITY): Payer: Self-pay | Admitting: Emergency Medicine

## 2019-11-25 ENCOUNTER — Ambulatory Visit (HOSPITAL_COMMUNITY)
Admission: EM | Admit: 2019-11-25 | Discharge: 2019-11-25 | Disposition: A | Discharge status: Home / Self Care | Payer: BC Managed Care – PPO | Attending: Family Medicine | Admitting: Family Medicine

## 2019-11-25 ENCOUNTER — Other Ambulatory Visit: Payer: Self-pay

## 2019-11-25 DIAGNOSIS — I83028 Varicose veins of left lower extremity with ulcer other part of lower leg: Secondary | ICD-10-CM

## 2019-11-25 DIAGNOSIS — L03116 Cellulitis of left lower limb: Secondary | ICD-10-CM

## 2019-11-25 DIAGNOSIS — L97821 Non-pressure chronic ulcer of other part of left lower leg limited to breakdown of skin: Secondary | ICD-10-CM | POA: Diagnosis not present

## 2019-11-25 MED ORDER — SULFAMETHOXAZOLE-TRIMETHOPRIM 800-160 MG PO TABS: 1.0000 | ORAL_TABLET | Freq: Two times a day (BID) | ORAL | 0 refills | Status: AC

## 2019-11-25 NOTE — ED Provider Notes (Signed)
[facility]   [id number] 11/25/19 Arrival Time: 1034  ASSESSMENT & PLAN:  1. Venous stasis ulcer of other part of left lower leg limited to breakdown of skin with varicose veins (HCC)   2. Cellulitis of left lower extremity     No indication for hospital evaluation at this time. VSS.  Begin: Meds ordered this encounter  Medications  . sulfamethoxazole-trimethoprim (BACTRIM DS) 800-160 MG tablet    Sig: Take 1 tablet by mouth 2 (two) times daily for 7 days.    Dispense:  14 tablet    Refill:  0    Recommend: Follow-up Information    Schedule an appointment as soon as possible for a visit  with Arlyce Harman, DO.   Specialty: Family Medicine Contact information: [address] [place] [address] [phone]           Discussed the wound care clinic with him. Will follow up here with any abrupt worsening of his current issues.  Reviewed expectations re: course of current medical issues. Questions answered. Outlined signs and symptoms indicating need for more acute intervention. Patient verbalized understanding. After Visit Summary given.   SUBJECTIVE:  Joshua Mcknight is a 59 y.o. male who presents with a skin complaint. Reports h/o venous stasis skin breakdown and ulcers in the past. Sometimes with infection. Current skin breakdown on L lower leg just above ankle for several weeks. Feels it has been healing; scabbed. Has noticed increased discomfort and redness "that is spreading this morning". Afebrile. No drainage or bleeding from area reported. Ambulatory without difficulty. No extremity sensation changes or weakness. OTC antibiotic oint without much change. No injury known to area of L lower leg.  OBJECTIVE: Vitals:   11/25/19 1116  BP: 118/71  Pulse: 77  Resp: 18  Temp: 98 F (36.7 C)  TempSrc: Oral  SpO2: 100%    General appearance: alert; no distress HEENT: [place]; AT Neck: supple with FROM Lungs: clear to auscultation  bilaterally Heart: regular rate and rhythm Extremities: no edema; moves all extremities normally Skin: warm and very dry; varicose veins present on LLE; healing, scabbed approx 1.5 cm diameter ulder just above rear ankle; no LLE edema; surrounding erythema present; hot to touch; no fluctuance; L ankle with FROM Psychological: alert and cooperative; normal mood and affect   Allergies  Allergen Reactions  . Food Shortness Of Breath, Rash and Other (See Comments)    Pt is allergic to coconut.      Past Medical History:  Diagnosis Date  . Emphysema of lung (HCC)   . Hyperlipidemia   . Thyroid disease    Social History   Socioeconomic History  . Marital status: Married    Spouse name: Not on file  . Number of children: Not on file  . Years of education: Not on file  . Highest education level: Not on file  Occupational History  . Not on file  Tobacco Use  . Smoking status: Current Every Day Smoker    Packs/day: 0.50    Years: 45.00    Pack years: 22.50    Types: Cigarettes    Start date: 10/31/1979  . Smokeless tobacco: Never Used  . Tobacco comment: Quit X2  Max PPD = 2 for 20 years then 1PPD  Substance and Sexual Activity  . Alcohol use: No    Alcohol/week: 0.0 standard drinks  . Drug use: No  . Sexual activity: Not on file  Other Topics Concern  . Not on file  Social  History Narrative  . Not on file   Social Determinants of Health   Financial Resource Strain:   . Difficulty of Paying Living Expenses: Not on file  Food Insecurity:   . Worried About Charity fundraiser in the Last Year: Not on file  . Ran Out of Food in the Last Year: Not on file  Transportation Needs:   . Lack of Transportation (Medical): Not on file  . Lack of Transportation (Non-Medical): Not on file  Physical Activity:   . Days of Exercise per Week: Not on file  . Minutes of Exercise per Session: Not on file  Stress:   . Feeling of Stress : Not on file  Social Connections:   . Frequency of  Communication with Friends and Family: Not on file  . Frequency of Social Gatherings with Friends and Family: Not on file  . Attends Religious Services: Not on file  . Active Member of Clubs or Organizations: Not on file  . Attends Archivist Meetings: Not on file  . Marital Status: Not on file  Intimate Partner Violence:   . Fear of Current or Ex-Partner: Not on file  . Emotionally Abused: Not on file  . Physically Abused: Not on file  . Sexually Abused: Not on file   Family History  Problem Relation Age of Onset  . CAD Father        CABG at 89  . Aneurysm Father   . Hyperlipidemia Mother   . Hypertension Mother   . Edema Mother   . Other Brother        artery blockage  . Bronchitis Brother   . Diabetes Mellitus I Brother    Past Surgical History:  Procedure Laterality Date  . KNEE SURGERY Left age 39     Vanessa Kick, MD 11/25/19 1141

## 2019-11-25 NOTE — ED Triage Notes (Signed)
Pt here for wound check to left ankle; pt sts present x 1 month

## 2019-11-29 ENCOUNTER — Other Ambulatory Visit: Payer: Self-pay | Admitting: Family Medicine

## 2019-12-03 ENCOUNTER — Other Ambulatory Visit: Payer: Self-pay | Admitting: Family Medicine

## 2019-12-05 ENCOUNTER — Other Ambulatory Visit: Payer: Self-pay

## 2019-12-05 MED ORDER — ATORVASTATIN CALCIUM 40 MG PO TABS: 40.0000 mg | ORAL_TABLET | Freq: Every day | ORAL | 3 refills | Status: DC

## 2019-12-22 ENCOUNTER — Other Ambulatory Visit: Payer: Self-pay

## 2019-12-22 ENCOUNTER — Ambulatory Visit (INDEPENDENT_AMBULATORY_CARE_PROVIDER_SITE_OTHER): Payer: BC Managed Care – PPO | Admitting: Family Medicine

## 2019-12-22 ENCOUNTER — Encounter: Payer: Self-pay | Admitting: Family Medicine

## 2019-12-22 VITALS — BP 106/58 | HR 82 | Wt 162.2 lb

## 2019-12-22 DIAGNOSIS — J449 Chronic obstructive pulmonary disease, unspecified: Secondary | ICD-10-CM | POA: Diagnosis not present

## 2019-12-22 DIAGNOSIS — E7800 Pure hypercholesterolemia, unspecified: Secondary | ICD-10-CM | POA: Diagnosis not present

## 2019-12-22 DIAGNOSIS — I872 Venous insufficiency (chronic) (peripheral): Secondary | ICD-10-CM

## 2019-12-22 DIAGNOSIS — E890 Postprocedural hypothyroidism: Secondary | ICD-10-CM | POA: Diagnosis not present

## 2019-12-22 MED ORDER — ALBUTEROL SULFATE HFA 108 (90 BASE) MCG/ACT IN AERS: 2.0000 | INHALATION_SPRAY | RESPIRATORY_TRACT | 2 refills | Status: DC | PRN

## 2019-12-22 MED ORDER — SPIRIVA RESPIMAT 1.25 MCG/ACT IN AERS: 1.0000 | INHALATION_SPRAY | Freq: Every day | RESPIRATORY_TRACT | 10 refills | Status: DC

## 2019-12-22 NOTE — Progress Notes (Signed)
    SUBJECTIVE:   CHIEF COMPLAINT / HPI: Check up  Leg Sores Patient states he is not bothered by his scabs and leg sore. But wife is concerned so she told him he needed to come. He was recently seen in the ED for his scar. He states it has no discharge and is slightly tender at times but does not hurt. It does itch. He does endorse picking at it. No fever, chills, nausea, or vomiting.  Hypothyroidism Long standing hypothyroidism with TSH that has been normal on of levothyroxine. Patient compliant with medication. No weight gain, constipation, fatigue, hair loss, or excessive appetite.  Hyperlipidemia Patient has history of high cholesterol. Last lipid panel was 12/10/2018 one year ago and had elevated LDL of 194. Patient was started on atorvastatin 40mg . Patient states he is complaint with medication.  COPD/Emphysema Patient has history of COPD emphysema. When last seen I had attempted to get him on a controller medication.   PERTINENT  PMH / PSH: Emphysema, HLD, Hypothyroidism, Tobacco use disorder, Hx of paranoia  OBJECTIVE:   Wt 162 lb 3.2 oz (73.6 kg)   BMI 23.95 kg/m   Gen: Alert and Oriented x 3, NAD CV: RRR, no murmurs, normal S1, S2 split Resp: CTAB, no wheezing, rales, or rhonchi, comfortable work of breathing Ext: no clubbing, cyanosis, or edema Skin: warm, dry, intact, no rashes; well healing scabbed area located just superior to the lateral ankle on the left, no surrounding erythema, not tender, no fluctuance, no warm  ASSESSMENT/PLAN:   COPD GOLD II criteria though poor effort/ still smoking  Per last visit with Dr. , patient was supposed to start on Spiriva Respimat however, he did not have enough money to pay for the inhaler. I discussed with him the importance of starting a controller medication and how it may help prevent the disease from getting worse. Of course, I told him the best way to prevent disease progression is to stop smoking but he is not  ready. I also instructed him to contact our clinic and leave me a message if he cannot afford Spiriva and we will try to see if he qualifies for medication assistance. - Spiriva Respimat daily - Albuterol prn  Hypothyroidism Checked TSH, still well controlled with normal TSH of 1.34. - Cont Levothyroxine Raymondo Band daily; repeat in one year  HYPERCHOLESTEROLEMIA Hyperlipidemia much improved with one year of Lipitor. LDL now 69 down from 194 from last year - Cont Lipitor 40mg  daily  Venous stasis dermatitis of both lower extremities Cont to apply cream and ointment Avoid picking at the scab Return to clinic if signs of infection which include fever, chills, redness, increasing pain or tenderness, swelling, or discharge     , DO [facility]

## 2019-12-22 NOTE — Patient Instructions (Signed)
It was great to see you today! Thank you for letting me participate in your care!  Today, we discussed your leg sores and I am glad they are doing better. They do not look infected. Please continue to apply ointment and cream to help with itching. Please do not pick at the scabs.  I am doing some routine labs for you today. I will call you if anything is abnormal.  I did prescribe you two inhalers that I would like you to start taking. Spiriva Respimat is an inhaler that can prevent your COPD from getting worse. Please use it once everyday. You can use Albuterol as needed. If you can't afford these please let our office know.  Be well, Jules Schick, DO PGY-3, [facility]

## 2019-12-23 LAB — LIPID PANEL
Chol/HDL Ratio: 3.7 ratio (ref 0.0–5.0)
Cholesterol, Total: 165 mg/dL (ref 100–199)
HDL: 45 mg/dL (ref >39)
LDL Chol Calc (NIH): 69 mg/dL (ref 0–99)
Triglycerides: 324 mg/dL — ABNORMAL HIGH (ref 0–149)
VLDL Cholesterol Cal: 51 mg/dL — ABNORMAL HIGH (ref 5–40)

## 2019-12-23 LAB — TSH+FREE T4
Free T4: 1.38 ng/dL (ref 0.82–1.77)
TSH: 1.34 u[IU]/mL (ref 0.450–4.500)

## 2019-12-23 NOTE — Assessment & Plan Note (Signed)
Checked TSH, still well controlled with normal TSH of 1.34. - Cont Levothyroxine daily; repeat in one year

## 2019-12-23 NOTE — Assessment & Plan Note (Signed)
Cont to apply cream and ointment Avoid picking at the scab Return to clinic if signs of infection which include fever, chills, redness, increasing pain or tenderness, swelling, or discharge

## 2019-12-23 NOTE — Assessment & Plan Note (Signed)
Hyperlipidemia much improved with one year of Lipitor. LDL now 69 down from 194 from last year - Cont Lipitor 40mg  daily

## 2019-12-23 NOTE — Assessment & Plan Note (Signed)
Per last visit with Dr. Raymondo Band, patient was supposed to start on Spiriva Respimat however, he did not have enough money to pay for the inhaler. I discussed with him the importance of starting a controller medication and how it may help prevent the disease from getting worse. Of course, I told him the best way to prevent disease progression is to stop smoking but he is not ready. I also instructed him to contact our clinic and leave me a message if he cannot afford Spiriva and we will try to see if he qualifies for medication assistance. - Spiriva Respimat daily - Albuterol prn

## 2020-01-23 ENCOUNTER — Ambulatory Visit: Payer: BC Managed Care – PPO | Attending: Internal Medicine

## 2020-01-23 ENCOUNTER — Ambulatory Visit: Payer: BC Managed Care – PPO | Admitting: Family Medicine

## 2020-01-23 DIAGNOSIS — Z23 Encounter for immunization: Secondary | ICD-10-CM

## 2020-01-23 NOTE — Progress Notes (Signed)
   Covid-19 Vaccination Clinic  Name:  Breylon Sherrow    MRN: [id number] DOB: 11-Feb-1961  01/23/2020  Mr. Thornton was observed post Covid-19 immunization for 15 minutes without incident. He was provided with Vaccine Information Sheet and instruction to access the V-Safe system.   Mr. Taubman was instructed to call 911 with any severe reactions post vaccine: Marland Kitchen Difficulty breathing  . Swelling of face and throat  . A fast heartbeat  . A bad rash all over body  . Dizziness and weakness   Immunizations Administered    Name Date Dose VIS Date Route   Pfizer COVID-19 Vaccine 01/23/2020 11:11 AM 0.3 mL 10/10/2019 Intramuscular   Manufacturer: [employer], [employer]   Lot: [id number]   NDC: [address]-0

## 2020-02-16 ENCOUNTER — Ambulatory Visit: Payer: BC Managed Care – PPO | Attending: Internal Medicine

## 2020-02-16 DIAGNOSIS — Z23 Encounter for immunization: Secondary | ICD-10-CM

## 2020-02-16 NOTE — Progress Notes (Signed)
   Covid-19 Vaccination Clinic  Name:  Joshua Mcknight    MRN: [id number] DOB: 07-09-1961  02/16/2020  Mr. Pohle was observed post Covid-19 immunization for 15 minutes without incident. He was provided with Vaccine Information Sheet and instruction to access the V-Safe system.   Mr. Jacober was instructed to call 911 with any severe reactions post vaccine: Marland Kitchen Difficulty breathing  . Swelling of face and throat  . A fast heartbeat  . A bad rash all over body  . Dizziness and weakness   Immunizations Administered    Name Date Dose VIS Date Route   Pfizer COVID-19 Vaccine 02/16/2020  2:06 PM 0.3 mL 12/24/2018 Intramuscular   Manufacturer: [employer], [employer]   Lot: [id number]   NDC: [address]-1

## 2020-05-19 ENCOUNTER — Other Ambulatory Visit: Payer: Self-pay

## 2020-05-19 ENCOUNTER — Encounter (HOSPITAL_COMMUNITY): Payer: Self-pay

## 2020-05-19 ENCOUNTER — Ambulatory Visit (HOSPITAL_COMMUNITY)
Admission: EM | Admit: 2020-05-19 | Discharge: 2020-05-19 | Disposition: A | Discharge status: Home / Self Care | Payer: BC Managed Care – PPO

## 2020-05-19 DIAGNOSIS — M79602 Pain in left arm: Secondary | ICD-10-CM

## 2020-05-19 NOTE — Discharge Instructions (Signed)
Nothing concerning on exam.  This is most likely some tendinitis or muscle strain.  This can happen with repetitive movements of the wrist and arm Recommend wearing a wrist splint to work to see if this helps Follow up as needed for continued or worsening symptoms

## 2020-05-19 NOTE — ED Triage Notes (Signed)
Pt presents to UC for right wrist pain x2 hours. Pt states he was twisting a valve at work, and noticed a sharp pain from wrist up arm. Pt now pain free and has full range of motion. No swelling, bruising or obvious deformity noted. No meds prior to arrival.

## 2020-05-19 NOTE — ED Provider Notes (Signed)
[facility]    CSN: [id number] Arrival date & time: 05/19/20  0857      History   Chief Complaint Chief Complaint  Patient presents with  . Wrist Pain    HPI Joshua Mcknight is a 59 y.o. male.   Pt is a 59 year old male presenting with left wrist issue. Reports sudden, severe pain in left wrist while at work and twisting valve. Pain has since resolved. Reports history of similar pain in past that also resolves on its own. Denies numbness, tingling, weakness, alterations in sensation. No alleviating or aggravating measures.  ROS per HPI      Past Medical History:  Diagnosis Date  . Emphysema of lung (HCC)   . Hyperlipidemia   . Thyroid disease     Patient Active Problem List   Diagnosis Date Noted  . Venous stasis dermatitis of both lower extremities 10/18/2019  . Knee pain, left 05/22/2018  . 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 Acute paranoia (HCC) 09/08/2015  . Elevated coronary artery calcium score 02/04/2014  . Poor dentition 05/15/2013  . NSAID long-term use 12/05/2012  . TINNITUS, CHRONIC, BILATERAL 02/12/2009  . Hypothyroidism 08/28/2007  . HYPERCHOLESTEROLEMIA 08/28/2007  . Cigarette smoker 08/28/2007    Past Surgical History:  Procedure Laterality Date  . KNEE SURGERY Left age 17       Home Medications    Prior to Admission medications   Medication Sig Start Date End Date Taking? Authorizing Provider  atorvastatin (LIPITOR) 40 MG tablet Take 1 tablet (40 mg total) by mouth daily. 12/05/19  Yes Arlyce Harman, DO  levothyroxine (SYNTHROID) 125 MCG tablet TAKE 1 TABLET BY MOUTH ONCE DAILY BEFORE BREAKFAST 12/03/19  Yes Lockamy, Timothy, DO  risperiDONE (RISPERDAL) 2 MG tablet TAKE 0.5 TABLETS (1 MG TOTAL) BY MOUTH 2 (TWO) TIMES DAILY. 12/01/19  Yes Lockamy, Timothy, DO  albuterol (VENTOLIN HFA) 108 (90 Base) MCG/ACT inhaler Inhale 2 puffs into the lungs every 4 (four) hours as needed for wheezing or shortness  of breath. 12/22/19   Lockamy, Timothy, DO  bacitracin ointment Apply 1 application topically 2 (two) times daily. 01/03/19   Eustace Moore, MD  Tiotropium Bromide Monohydrate (SPIRIVA RESPIMAT) 1.25 MCG/ACT AERS Inhale 1 Dose into the lungs daily. Patient not taking: Reported on 05/19/2020 12/22/19   Arlyce Harman, DO    Family History Family History  Problem Relation Age of Onset  . CAD Father        CABG at 77  . Aneurysm Father   . Hyperlipidemia Mother   . Hypertension Mother   . Edema Mother   . Other Brother        artery blockage  . Bronchitis Brother   . Diabetes Mellitus I Brother     Social History Social History   Tobacco Use  . Smoking status: Current Every Day Smoker    Packs/day: 0.50    Years: 45.00    Pack years: 22.50    Types: Cigarettes    Start date: 10/31/1979  . Smokeless tobacco: Never Used  . Tobacco comment: Quit X2  Max PPD = 2 for 20 years then 1PPD  Substance Use Topics  . Alcohol use: No    Alcohol/week: 0.0 standard drinks  . Drug use: No     Allergies   Food   Review of Systems Review of Systems  Constitutional: Negative.   Musculoskeletal: Negative.   Neurological: Negative.      Physical Exam  Triage Vital Signs ED Triage Vitals  Enc Vitals Group     BP 05/19/20 0911 120/79     Pulse Rate 05/19/20 0911 65     Resp 05/19/20 0911 16     Temp 05/19/20 0911 98.2 F (36.8 C)     Temp Source 05/19/20 0911 Oral     SpO2 05/19/20 0911 99 %     Weight --      Height --      Head Circumference --      Peak Flow --      Pain Score 05/19/20 0910 0     Pain Loc --      Pain Edu? --      Excl. in GC? --    No data found.  Updated Vital Signs BP 120/79 (BP Location: Right Arm)   Pulse 65   Temp 98.2 F (36.8 C) (Oral)   Resp 16   SpO2 99%   Visual Acuity Right Eye Distance:   Left Eye Distance:   Bilateral Distance:    Right Eye Near:   Left Eye Near:    Bilateral Near:     Physical Exam Constitutional:       Appearance: Normal appearance. He is normal weight.  Cardiovascular:     Pulses:          Radial pulses are 2+ on the right side and 2+ on the left side.  Musculoskeletal:        General: Normal range of motion.     Left wrist: Normal. No swelling, deformity, effusion, tenderness, bony tenderness or crepitus. Normal range of motion. Normal pulse.  Neurological:     Mental Status: He is alert.      UC Treatments / Results  Labs (all labs ordered are listed, but only abnormal results are displayed) Labs Reviewed - No data to display  EKG   Radiology No results found.  Procedures Procedures (including critical care time)  Medications Ordered in UC Medications - No data to display  Initial Impression / Assessment and Plan / UC Course  I have reviewed the triage vital signs and the nursing notes.  Pertinent labs & imaging results that were available during my care of the patient were reviewed by me and considered in my medical decision making (see chart for details).     Left arm pain/wrist pain.  Most likely this tendinitis issue or muscle strain due to repetitive movements of the wrist.  His symptoms are completely resolved at this time.  Normal range of motion Recommended wrist splint to work if this continues Follow up as needed for continued or worsening symptoms  Final Clinical Impressions(s) / UC Diagnoses   Final diagnoses:  Left arm pain     Discharge Instructions     Nothing concerning on exam.  This is most likely some tendinitis or muscle strain.  This can happen with repetitive movements of the wrist and arm Recommend wearing a wrist splint to work to see if this helps Follow up as needed for continued or worsening symptoms     ED Prescriptions    None     PDMP not reviewed this encounter.   Janace Aris, NP 05/19/20 1145

## 2020-09-17 ENCOUNTER — Other Ambulatory Visit: Payer: Self-pay

## 2020-09-18 MED ORDER — RISPERIDONE 2 MG PO TABS: 1.0000 mg | ORAL_TABLET | Freq: Two times a day (BID) | ORAL | 0 refills | Status: DC

## 2020-11-09 NOTE — Progress Notes (Signed)
SUBJECTIVE:   Chief compliant/HPI: annual examination  Joshua Mcknight is a 60 y.o. who presents today for an annual exam.   PMH: elevated coronary artery calcium score, COPD, poor dentition, hypothyroidism, venous stasis dermatitis, paranoia, tobacco user, HLD, chronic bilateral tinnitus  Hypothyroidism: Acquired hypothyroidism 2/2 eradication from radioactive iodine in the 90's. Last TSH 1.240, T4 1.38 in Feb 2021. Currently on Synthroid QD. Endorses compliance. Denies any constipation/diarrhea, heat/cold intolerance, skin/hair changes, difficulty sleeping or worsening anxiety.  HLD: Last lipid panel below. Currently on Atorvastatin 40mg  QD. Endorses compliance. Denies any muscles aches or weakness. The 10-year ASCVD risk score DC Jr., et al., 2013) is: 9.6%   Lab Results  Component Value Date   CHOL 165 12/22/2019   HDL 45 12/22/2019   LDLCALC 69 12/22/2019   LDLDIRECT 107 (H) 12/05/2012   TRIG 324 (H) 12/22/2019   CHOLHDL 3.7 12/22/2019   COPD Chronic. GOLD II. Currently on Spiriva QD and Albuterol PRN but noncompliant. Unable to afford medications. Not interested in alternative medications. Denies any SOB or difficulties breathing. Does not feel he needs inhalers at this time.  Social History: Alcohol: none Tobacco: 0.75 pack/day, not interested in quitting  Illicit Drugs: none Safe at home: yes Depression/Suicidality: no. Has history of paranoia and depression. Currently on Risperidone 1mg  BID which helps his symptoms.  Health Maintenance: Health Maintenance Due  Topic  . COLONOSCOPY (Pts 45-40yrs Insurance coverage will need to be confirmed)    No family history of breast or colon cancer to suggest early screening.  Review of systems: see HPI  OBJECTIVE:   BP 110/60   Pulse 74   Ht 5\' 9"  (1.753 m)   Wt 156 lb (70.8 kg)   SpO2 97%   BMI 23.04 kg/m   General: pleasant older man, appears older than stated age, sitting comfortably in exam chair,  well nourished, well developed, in no acute distress with non-toxic appearance HEENT: normocephalic, atraumatic, moist mucous membranes, oropharynx clear without erythema or exudate, TM normal bilaterally  Neck: supple, non-tender without lymphadenopathy CV: regular rate and rhythm without murmurs, rubs, or gallops, no lower extremity edema, 2+ radial and pedal pulses bilaterally Lungs: clear to auscultation bilaterally with normal work of breathing on room air, speaking in full sentences Abdomen: soft, non-tender, non-distended Skin: warm, dry Extremities: warm and well perfused, normal tone MSK: gait normal Neuro: Alert and oriented, speech normal  ASSESSMENT/PLAN:   Annual Physical Exam: Patient here today for annual physical exam.  PMH, surgical history, and social history were reviewed. The following concerns below were discussed.   COPD GOLD II criteria though poor effort/ still smoking  Chronic. Asymptomatic. Current everyday smoker. Noncompliant with inhalers. Not interested in more affordable option at this time.  - recommended follow up if becomes symptomatic or worsening SOB. Patient voiced understanding and agreement with plan.  - encouraged smoking cessation  Hypothyroidism Chronic.  - Continue Synthroid QD - TSH today to monitor - adjust if indicated  Cigarette smoker Current everyday smoker. Smokes 0.75 pack/day x 45 years (35 pack year history). Does qualify for lung cancer screen at this time however patient declines screening. He denies wanting any treatment if they were to find anything. May consider if becomes symptomatic. Patient educated on importance of early detection. Patient voiced understanding and agreement with plan. Patient was counseled on the risks of tobacco use and cessation strongly encouraged.  Mixed hyperlipidemia Chronic.  - continue Atorvastatin 40mg  QD - lipid panel today  -  will increase to 80mg  if elevated >100, consider if >70 - may  benefit from zetia given history of elevated Trig   Mild depression (HCC) PHQ-9 score of 11. No SI/HI. Symptoms well controlled with Risperidone 1mg  BID - continue current meds as prescribed, refills provided   Annual Examination  See AVS for age appropriate recommendations.  PHQ score 11, reviewed and discussed. Currently on Risperidone and doing well. Denies SI/HI. Blood pressure value is at goal, discussed.   Considered the following screening exams based upon USPSTF recommendations: Diabetes screening: Screen with random glucose in BMP Screening for elevated cholesterol: ordered HIV testing: Negative 2017. Not at high risk. Hepatitis C: Negative in 2017. Not at high risk.  Hepatitis B: ordered Reviewed risk factors for latent tuberculosis and not indicated Colorectal cancer screening: discussed and declined today.  Possible family history of colon cancer thus would not qualify for cologuard.  Lung cancer screening: discussed and declined  PSA discussed and after engaging in discussion of possible risks, benefits and complications of screening patient elected to obtain PSA. Prostate cancer runs in family.   Vaccinations: COVID booster given today  Follow up in 1 year or sooner if indicated.    2018, DO [facility]

## 2020-11-10 ENCOUNTER — Encounter: Payer: Self-pay | Admitting: Family Medicine

## 2020-11-10 ENCOUNTER — Other Ambulatory Visit: Payer: Self-pay

## 2020-11-10 ENCOUNTER — Ambulatory Visit (INDEPENDENT_AMBULATORY_CARE_PROVIDER_SITE_OTHER): Payer: BC Managed Care – PPO | Admitting: Family Medicine

## 2020-11-10 VITALS — BP 110/60 | HR 74 | Ht 69.0 in | Wt 156.0 lb

## 2020-11-10 DIAGNOSIS — F32A Depression, unspecified: Secondary | ICD-10-CM

## 2020-11-10 DIAGNOSIS — Z23 Encounter for immunization: Secondary | ICD-10-CM | POA: Diagnosis not present

## 2020-11-10 DIAGNOSIS — Z125 Encounter for screening for malignant neoplasm of prostate: Secondary | ICD-10-CM | POA: Diagnosis not present

## 2020-11-10 DIAGNOSIS — E890 Postprocedural hypothyroidism: Secondary | ICD-10-CM

## 2020-11-10 DIAGNOSIS — E782 Mixed hyperlipidemia: Secondary | ICD-10-CM

## 2020-11-10 DIAGNOSIS — J449 Chronic obstructive pulmonary disease, unspecified: Secondary | ICD-10-CM | POA: Diagnosis not present

## 2020-11-10 DIAGNOSIS — E7800 Pure hypercholesterolemia, unspecified: Secondary | ICD-10-CM | POA: Diagnosis not present

## 2020-11-10 DIAGNOSIS — F22 Delusional disorders: Secondary | ICD-10-CM

## 2020-11-10 DIAGNOSIS — Z8042 Family history of malignant neoplasm of prostate: Secondary | ICD-10-CM | POA: Diagnosis not present

## 2020-11-10 DIAGNOSIS — Z0000 Encounter for general adult medical examination without abnormal findings: Secondary | ICD-10-CM

## 2020-11-10 DIAGNOSIS — F17210 Nicotine dependence, cigarettes, uncomplicated: Secondary | ICD-10-CM

## 2020-11-10 DIAGNOSIS — F320 Major depressive disorder, single episode, mild: Secondary | ICD-10-CM | POA: Insufficient documentation

## 2020-11-10 DIAGNOSIS — Z1159 Encounter for screening for other viral diseases: Secondary | ICD-10-CM

## 2020-11-10 MED ORDER — RISPERIDONE 2 MG PO TABS: 1.0000 mg | ORAL_TABLET | Freq: Two times a day (BID) | ORAL | 0 refills | Status: DC

## 2020-11-10 MED ORDER — LEVOTHYROXINE SODIUM 125 MCG PO TABS: 125.0000 ug | ORAL_TABLET | Freq: Every day | ORAL | 3 refills | Status: DC

## 2020-11-10 MED ORDER — ATORVASTATIN CALCIUM 40 MG PO TABS: 40.0000 mg | ORAL_TABLET | Freq: Every day | ORAL | 3 refills | Status: DC

## 2020-11-10 NOTE — Patient Instructions (Signed)
It was a pleasure to see you today!  Thank you for choosing [facility] for your primary care.   Our plans for today were:  Strongly encourage smoking cessation. Please call if you are interested in nicotine replacement  Please call if you're developing any shortness of breath as this may mean you need to restart your inhalers  Please continue your medications as prescribed. I will call you if any changes need to be made  I have obtained blood work, I will call you if anything is abnormal otherwise I will send a letter.  To keep you healthy, please keep in mind the following health maintenance items that you are due for:   1. Lung cancer screening with low dose CT scan 2.  Colon cancer screen with colonoscopy 3. Flu vaccine   You should return to our clinic in 1 year for annual exam  Best Wishes,   Orpah Cobb, DO

## 2020-11-10 NOTE — Assessment & Plan Note (Signed)
Chronic.  - continue Atorvastatin 40mg  QD - lipid panel today  - will increase to 80mg  if elevated >100, consider if >70 - may benefit from zetia given history of elevated Trig

## 2020-11-10 NOTE — Assessment & Plan Note (Signed)
Current everyday smoker. Smokes 0.75 pack/day x 45 years (35 pack year history). Does qualify for lung cancer screen at this time however patient declines screening. He denies wanting any treatment if they were to find anything. May consider if becomes symptomatic. Patient educated on importance of early detection. Patient voiced understanding and agreement with plan. Patient was counseled on the risks of tobacco use and cessation strongly encouraged.

## 2020-11-10 NOTE — Assessment & Plan Note (Signed)
PHQ-9 score of 11. No SI/HI. Symptoms well controlled with Risperidone 1mg  BID - continue current meds as prescribed, refills provided

## 2020-11-10 NOTE — Assessment & Plan Note (Signed)
Chronic. Asymptomatic. Current everyday smoker. Noncompliant with inhalers. Not interested in more affordable option at this time.  - recommended follow up if becomes symptomatic or worsening SOB. Patient voiced understanding and agreement with plan.  - encouraged smoking cessation

## 2020-11-10 NOTE — Assessment & Plan Note (Signed)
Chronic.  - Continue Synthroid QD - TSH today to monitor - adjust if indicated

## 2020-11-11 ENCOUNTER — Encounter: Payer: Self-pay | Admitting: Family Medicine

## 2020-11-11 LAB — CBC
Hematocrit: 43.2 % (ref 37.5–51.0)
Hemoglobin: 14.6 g/dL (ref 13.0–17.7)
MCH: 30 pg (ref 26.6–33.0)
MCHC: 33.8 g/dL (ref 31.5–35.7)
MCV: 89 fL (ref 79–97)
Platelets: 267 10*3/uL (ref 150–450)
RBC: 4.86 x10E6/uL (ref 4.14–5.80)
RDW: 11.7 % (ref 11.6–15.4)
WBC: 10.8 10*3/uL (ref 3.4–10.8)

## 2020-11-11 LAB — LIPID PANEL
Chol/HDL Ratio: 3.1 ratio (ref 0.0–5.0)
Cholesterol, Total: 149 mg/dL (ref 100–199)
HDL: 48 mg/dL (ref >39)
LDL Chol Calc (NIH): 69 mg/dL (ref 0–99)
Triglycerides: 190 mg/dL — ABNORMAL HIGH (ref 0–149)
VLDL Cholesterol Cal: 32 mg/dL (ref 5–40)

## 2020-11-11 LAB — BASIC METABOLIC PANEL
BUN/Creatinine Ratio: 6 — ABNORMAL LOW (ref 10–24)
BUN: 5 mg/dL — ABNORMAL LOW (ref 8–27)
CO2: 27 mmol/L (ref 20–29)
Calcium: 9.7 mg/dL (ref 8.6–10.2)
Chloride: 99 mmol/L (ref 96–106)
Creatinine, Ser: 0.83 mg/dL (ref 0.76–1.27)
GFR calc Af Amer: 111 mL/min/{1.73_m2} (ref >59)
GFR calc non Af Amer: 96 mL/min/{1.73_m2} (ref >59)
Glucose: 77 mg/dL (ref 65–99)
Potassium: 4 mmol/L (ref 3.5–5.2)
Sodium: 140 mmol/L (ref 134–144)

## 2020-11-11 LAB — TSH: TSH: 3.62 u[IU]/mL (ref 0.450–4.500)

## 2020-11-11 LAB — PSA: Prostate Specific Ag, Serum: 0.4 ng/mL (ref 0.0–4.0)

## 2020-11-11 LAB — HEPATITIS B SURFACE ANTIGEN: Hepatitis B Surface Ag: NEGATIVE

## 2020-11-12 DIAGNOSIS — Z1152 Encounter for screening for COVID-19: Secondary | ICD-10-CM | POA: Diagnosis not present

## 2020-11-29 ENCOUNTER — Other Ambulatory Visit: Payer: Self-pay

## 2020-11-29 ENCOUNTER — Ambulatory Visit (INDEPENDENT_AMBULATORY_CARE_PROVIDER_SITE_OTHER): Payer: BC Managed Care – PPO | Admitting: Family Medicine

## 2020-11-29 DIAGNOSIS — K047 Periapical abscess without sinus: Secondary | ICD-10-CM | POA: Diagnosis not present

## 2020-11-29 MED ORDER — AMOXICILLIN 500 MG PO CAPS: 500.0000 mg | ORAL_CAPSULE | Freq: Three times a day (TID) | ORAL | 0 refills | Status: DC

## 2020-11-29 NOTE — Progress Notes (Signed)
    SUBJECTIVE:   CHIEF COMPLAINT / HPI: right facial swelling   Patient reports right side of his face became swollen 4 days ago.  Patient reports that most of his teeth have been pulled however he has not been able to get all of his teeth pulled.  Has history of poor dentition.  He denies any ear pain or sore throat.  Patient states that he noticed the pain and then after taking an pain pill from his wife 1 day ago, he woke up with a headache that was relieved after taking 600 mg of ibuprofen.  Patient states that he does not currently have a headache.  He reports feeling feverish but did not take temperature.  Patient did not have any nausea or vomiting.  He states that the abscess "popped" earlier today and relieve some of the pain.  He reports that he still has some maxillary area swelling.  He denies any changes to his vision.   PERTINENT  PMH / PSH:  MDD  COPD Poor dentition  OBJECTIVE:   BP 110/70   Pulse 82   Ht 5\' 10"  (1.778 m)   Wt 156 lb 6.4 oz (70.9 kg)   SpO2 99%   BMI 22.44 kg/m   General: Male appearing stated age in no acute distress HEENT: MMM, healing oral lesion noted on right sided upper gum where,Neck non-tender without lymphadenopathy, no active draining or bleeding from lesion, some erythema & small aphthous ulcer, normal bilateral tympanic membranes, no rhinorrhea, no oropharyngeal erythema or exudate Pulm: Normal respiratory effort Neuro: pt alert and oriented x4, follows commands, PERRLA, EOMI bilaterally   ASSESSMENT/PLAN:   Dental infection Patient with limited superior gumline erythema and evidence of ulceration consistent with reported history of drainage prior to this appointment. Do not note any active drainage or bleeding at this time. Given patient's surrounding poor dentition and limited financial ability to see a dentist soon, will prescribe amoxicillin 500 mg 3 times daily for 4-day course and recommend follow-up with dentist as soon as  possible. Discussed return precautions including fever, difficulty breathing, headache, ear pain or vision changes. Patient was in agreement with this plan.     , MD [facility]

## 2020-11-29 NOTE — Assessment & Plan Note (Addendum)
Patient with limited superior gumline erythema and evidence of ulceration consistent with reported history of drainage prior to this appointment. Do not note any active drainage or bleeding at this time. Given patient's surrounding poor dentition and limited financial ability to see a dentist soon, will prescribe amoxicillin 500 mg 3 times daily for 4-day course and recommend follow-up with dentist as soon as possible. Discussed return precautions including fever, difficulty breathing, headache, ear pain or vision changes. Patient was in agreement with this plan.

## 2020-11-29 NOTE — Patient Instructions (Signed)
It appears that you have an infection in your gums that is healing after draining it still.  I will prescribe antibiotic for you to take 3 times daily for 4 days.  You should return to care if you experience worsening abdominal pain, fevers, chills, began to have nausea vomiting, or any changes to your vision.  I recommended she follow-up with a dentist as soon as possible.

## 2020-12-29 ENCOUNTER — Other Ambulatory Visit: Payer: Self-pay

## 2020-12-29 ENCOUNTER — Ambulatory Visit (INDEPENDENT_AMBULATORY_CARE_PROVIDER_SITE_OTHER): Payer: BC Managed Care – PPO | Admitting: Student in an Organized Health Care Education/Training Program

## 2020-12-29 VITALS — BP 118/72 | HR 87 | Wt 162.4 lb

## 2020-12-29 DIAGNOSIS — M25562 Pain in left knee: Secondary | ICD-10-CM | POA: Diagnosis not present

## 2020-12-29 DIAGNOSIS — S838X2A Sprain of other specified parts of left knee, initial encounter: Secondary | ICD-10-CM | POA: Diagnosis not present

## 2020-12-29 NOTE — Patient Instructions (Addendum)
It was a pleasure to see you today!  To summarize our discussion for this visit:  For your knee pain- it looks as though you have a meniscus injury likely from degeneration or aging  I sent in an ortho referral  I think you would benefit from physical therapy so sent in a referral  You can take tylenol/ibuprofen as needed.  Try a compression knee brace, ice, rest, elevation. voltaren gel  See [place] again if needed   Please stop smoking so your body can heal better!   Call the clinic at [phone] if your symptoms worsen or you have any concerns.   Thank you for allowing me to take part in your care,  Dr. Jamelle Rushing   Meniscus Tear, Phase I Rehab After Surgery Ask your health care provider which exercises are safe for you. Do exercises exactly as told by your health care provider and adjust them as directed. It is normal to feel mild stretching, pulling, tightness, or discomfort as you do these exercises. Stop right away if you feel sudden pain or your pain gets worse. Do not begin these exercises until told by your health care provider. If told by your health care provider, wear your brace while you do these exercises. Stretching and range-of-motion exercises These exercises warm up your muscles and joints and improve the movement and flexibility of your knee. These exercises also help to relieve pain and stiffness. You may be asked to limit your range of motion if you had a meniscus repair. Talk with your health care provider about these restrictions. Passive knee flexion, supine You do this exercise while lying on your back (supine position). 1. Start this exercise in one of these positions: ? Lying on the floor in front of an open doorway, with your left / right heel and foot lightly touching the wall. ? Lying on a bed with both of your feet on the wall or headboard. 2. Without using any effort (passive), allow gravity to slide your foot down the wall slowly until you feel  a gentle stretch (flexion) in the front of your left / right knee, or until your knee reaches the angle that your health care provider tells you. 3. Hold this stretch for __________ seconds. 4. Return your leg to the starting position, using your healthy leg to do the work or to help if needed. Repeat __________ times. Complete this exercise __________ times a day.   Passive knee extension, sitting 1. Sit with your left / right heel propped up on a chair, a coffee table, or a footstool. Do not have anything under your knee to support it. 2. Without using any effort (passive), allow your leg muscles to relax, letting gravity straighten out your knee (extension). Do not let your knee turn inward. You should feel a stretch behind your left / right knee. 3. If told by your health care provider, increase the stretch by placing a __________ lb / kg weight on your thigh, just above your kneecap. 4. Hold this position for __________ seconds. Repeat __________ times. Complete this exercise __________ times a day.   Passive gastrocnemius stretch 1. Sit on the floor with your left / right leg extended. 2. Loop a belt or towel around the ball of your left / right foot. The ball of your foot is on the walking surface, right under your toes. 3. Keep your left / right ankle and foot relaxed and keep your knee straight. Without moving the foot yourself (passive), use  the belt or towel to pull your foot and ankle toward you. You should feel a gentle stretch in your calf or behind your knee. 4. Hold this position for __________ seconds. Repeat __________ times. Complete this exercise __________ times a day.   Strengthening exercises These exercises build strength and endurance in your knee. Endurance is the ability to use your muscles for a long time, even after they get tired. Quadriceps, isometric This exercise strengthens the muscles in front of your thigh (quadriceps) without moving your knee joint  (isometric). 1. Lie on your back with your left / right leg extended and your other knee bent. 2. Slowly tense the muscles in the front of your left / right thigh. You should see your kneecap slide up toward your hip or see increased dimpling just above the knee. This motion will push the back of your knee toward the floor. 3. For __________ seconds, hold the muscle as tight as you can without increasing your pain. 4. Relax the muscles slowly and completely. Repeat __________ times. Complete this exercise __________ times a day.   Straight leg raises, supine This exercise strengthens the muscles in the front of your thigh (quadriceps). 1. Lie on your back (supine position) with your left / right leg extended and your other knee bent. 2. Tense the muscles in the front of your left / right thigh. You should see your kneecap slide up toward your hip or see increased dimpling just above the knee. 3. Keep these muscles tight as you raise your leg 4-6 inches (10-15 cm) off the floor. Do not let your knee bend. 4. Hold this position for __________ seconds. 5. Keep these muscles tense as you lower your leg. 6. Relax these muscles slowly and completely after each repetition. Repeat __________ times. Complete this exercise __________ times a day. Straight leg raises, side-lying This exercise strengthens the muscles that rotate the leg at the hip and move it away from your body (hip abductors). 1. Lie on your side with your left / right leg in the top position. Lie so your head, shoulder, hip, and knee line up. You may bend your bottom knee to help you keep your balance. 2. Turn your hips slightly forward so your hips are stacked directly over each other and your left / right knee is facing forward. 3. Leading with your heel, lift your top leg 4-6 inches (10-15 cm), keeping your toes pointed straight ahead. ? Do not let your foot drift forward. ? Do not let your knee turn toward the ceiling. 4. Hold this  position for __________ seconds. 5. Slowly lower your leg to the starting position. 6. Allow your muscles to relax completely after each repetition. Repeat __________ times. Complete this exercise __________ times a day.   This information is not intended to replace advice given to you by your health care provider. Make sure you discuss any questions you have with your health care provider. Document Revised: 02/16/2020 Document Reviewed: 02/16/2020 Elsevier Patient Education  2021 [employer].

## 2020-12-29 NOTE — Progress Notes (Signed)
   SUBJECTIVE:   CHIEF COMPLAINT / HPI: Knee pain  Knee pain- 1 week of left knee pain with catching and worse with pivoting.  No swelling or redness.  Not constant or at rest Starting to hobble and causing problems  No accident, trauma, fall Was walking when it spontaneously started Has not tried anything for it. Lasts 10-15 seconds.  Has an audible and noticeable clicking with straightening of his knee which does not cause pain when sitting.  Only hurts when he has weight on his knee. No locking in place. 40 years ago had broken knee and surgery Requesting to be seen by Dr. Jillyn [facility] at [facility].  OBJECTIVE:   BP 118/72   Pulse 87   Wt 162 lb 6.4 oz (73.7 kg)   SpO2 97%   BMI 23.30 kg/m   Physical Exam Vitals and nursing note reviewed.  Constitutional:      Appearance: Normal appearance. He is normal weight. He is not ill-appearing.  HENT:     Head: Normocephalic.  Pulmonary:     Effort: Pulmonary effort is normal.  Musculoskeletal:     Right knee: No swelling, effusion, erythema, bony tenderness or crepitus. Normal range of motion. No tenderness. No LCL laxity, MCL laxity, ACL laxity or PCL laxity. Normal alignment and normal patellar mobility.     Instability Tests: Anterior drawer test negative. Posterior drawer test negative. Anterior Lachman test negative. Medial McMurray test negative.     Left knee: No swelling, effusion, erythema, bony tenderness or crepitus. Normal range of motion. No tenderness. No LCL laxity, MCL laxity, ACL laxity or PCL laxity.Normal alignment and normal patellar mobility.     Instability Tests: Anterior drawer test negative. Posterior drawer test negative. Anterior Lachman test negative. Medial McMurray test negative.     Comments: Negative logroll, negative FABER/FADIR.  Normal range of motion without pain. Lower extremity strength full and symmetric bilateral. Knees-negative for tenderness at joint line palpation, negative patellar grind,  negative McMurray bilaterally, drawer tests normal, no tenderness at tibial tuberosity. No erythema, effusion, Baker's cyst Positive Thessaly on left for pain and clicking/popping of knee  Skin:    Comments: Yellow discoloration of fingertips  Neurological:     General: No focal deficit present.     Mental Status: He is alert and oriented to person, place, and time.  Psychiatric:        Mood and Affect: Mood normal.    ASSESSMENT/PLAN:   Left knee pain Left knee pain without traumatic event. Negative for effusion, redness, warmth on exam so have low suspicion for infection, gout, injury. Given history and exam and remote history of knee fracture patient likely has degenerative worsening or tear of meniscus. Provided patient with at home exercises and recommended physical therapy formally if this does not improve his symptoms Also suggested he use a compression sleeve on his knee, ice or heat, rest, as needed pain medications Referral placed for Ortho to Dr. Jillyn Hidden at [facility], per patient request Also had frank conversation with the patient about his smoking and how this habit can decrease his ability to heal     Leeroy Bock, DO [facility]

## 2020-12-30 NOTE — Assessment & Plan Note (Addendum)
Left knee pain without traumatic event. Negative for effusion, redness, warmth on exam so have low suspicion for infection, gout, injury. Given history and exam and remote history of knee fracture patient likely has degenerative worsening or tear of meniscus. Provided patient with at home exercises and recommended physical therapy formally if this does not improve his symptoms Also suggested he use a compression sleeve on his knee, ice or heat, rest, as needed pain medications Referral placed for Ortho to Dr. Jillyn Hidden at [facility], per patient request Also had frank conversation with the patient about his smoking and how this habit can decrease his ability to heal

## 2021-01-08 ENCOUNTER — Ambulatory Visit: Payer: BC Managed Care – PPO

## 2021-01-18 ENCOUNTER — Ambulatory Visit: Payer: BC Managed Care – PPO | Attending: Family Medicine

## 2021-01-18 ENCOUNTER — Other Ambulatory Visit: Payer: Self-pay

## 2021-01-18 DIAGNOSIS — R262 Difficulty in walking, not elsewhere classified: Secondary | ICD-10-CM | POA: Diagnosis not present

## 2021-01-18 DIAGNOSIS — M6281 Muscle weakness (generalized): Secondary | ICD-10-CM | POA: Diagnosis not present

## 2021-01-18 DIAGNOSIS — M25562 Pain in left knee: Secondary | ICD-10-CM | POA: Insufficient documentation

## 2021-01-18 DIAGNOSIS — M25662 Stiffness of left knee, not elsewhere classified: Secondary | ICD-10-CM | POA: Insufficient documentation

## 2021-01-18 DIAGNOSIS — G8929 Other chronic pain: Secondary | ICD-10-CM | POA: Diagnosis not present

## 2021-01-19 NOTE — Therapy (Signed)
[facility] [address] [place], [place], [address] Phone: [phone]   Fax:  [phone]  Physical Therapy Evaluation  Patient Details  Name: Joshua Mcknight MRN: [id number] Date of Birth: 06/05/1961 Referring Provider (PT): Carney Living, MD   Encounter Date: 01/18/2021   PT End of Session - 01/18/21 1653    Visit Number 1    Number of Visits 7    Date for PT Re-Evaluation 03/05/21    Authorization Type [employer] - FOTO visit 6 and visit 10    PT Start Time 1700    PT Stop Time 1744    PT Time Calculation (min) 44 min    Activity Tolerance Patient tolerated treatment well    Behavior During Therapy [facility] for tasks assessed/performed           Past Medical History:  Diagnosis Date  . Emphysema of lung (HCC)   . Hyperlipidemia   . Thyroid disease     Past Surgical History:  Procedure Laterality Date  . KNEE SURGERY Left age 31    There were no vitals filed for this visit.    Subjective Assessment - 01/18/21 1703    Subjective "I told the doctor that it had just started, but it's actually been going on for a couple years. I thought I was twisting my knee when I stepped, but I have to realize it is actually when I'm coming off of the knee that it hurts. Right after I went to the doctor, for about 3 weeks, it really aggravated me. Since then, it has calmed down. If I step wrong, it will send a twinge down my leg. It happens more when I've got my leg fully extended. It has a catch and that's what I'm feeling when it does give me pain."    Pertinent History See PMH above    Limitations Standing;Walking;House hold activities    How long can you sit comfortably? Denies limitation    How long can you stand comfortably? "I can stand on it but it will bother me if it's in a locked or extended position. I stand on my feet all day long, and I'm constantly pivoting from one foot to the other because my lower legs have bothered me  for a long time."    How long can you walk comfortably? Denies limitation    Patient Stated Goals "get rid of the pain"    Currently in Pain? Yes    Pain Score 1    .5-1/10   Pain Location Knee    Pain Orientation Left    Pain Descriptors / Indicators Aching;Dull    Pain Type Chronic pain    Pain Onset More than a month ago    Pain Frequency Intermittent    Aggravating Factors  locked/extended L knee especially in weightbearing    Pain Relieving Factors aspirin    Effect of Pain on Daily Activities "when it first happened, it threw me off my game. it's part of everyday now. I'll be walking and it all of sudden feels like someone jabbed something in my knee."              [facility] PT Assessment - 01/19/21 0001      Assessment   Medical Diagnosis Injury of meniscus of left knee, initial encounter (J62.8B1D)    Referring Provider (PT) Carney Living, MD    Onset Date/Surgical Date --   1-2 years ago onset but worsened recently  Hand Dominance Right    Next MD Visit 01/26/2021   appointment 01/26/2021 with a surgeon to discuss options   Prior Therapy Not since L knee injury 30 years ago      Precautions   Precautions None      Restrictions   Weight Bearing Restrictions No      Home Environment   Living Environment Private residence    Living Arrangements Spouse/significant other;Other (Comment)   wife and dogs   Available Help at Discharge Family    Type of Home House    Home Access Stairs to enter    Entrance Stairs-Number of Steps 5 in the front; 3 in the back    Entrance Stairs-Rails Cannot reach both   handrails for steps at the front. 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 consists of standing prolonged time periods and  frequent turning/pivoting    Leisure watch grandkids play ball      Cognition   Overall Cognitive Status Within Functional Limits for tasks assessed      Observation/Other Assessments   Focus on Therapeutic Outcomes (FOTO)  56% function; predicted 70% function      AROM   Right Knee Extension 0    Right Knee Flexion 127    Left Knee Extension -4    Left Knee Flexion 112      PROM   Left Knee Extension -3    Left Knee Flexion 117      Strength   Overall Strength Comments BLE MMT grossly 4+/5 with exception of bilateral hip flexion. No complaints of pain with LLE resistance    Right Hip Flexion 3+/5    Left Hip Flexion 3+/5                      Objective measurements completed on examination: See above findings.       [facility] Adult PT Treatment/Exercise - 01/19/21 0001      Transfers   Five time sit to stand comments  22.18 seconds from low mat      Self-Care   Self-Care Other Self-Care Comments    Other Self-Care Comments  See patient education      Exercises   Exercises Knee/Hip                  PT Education - 01/18/21 1656    Education Details Initial HEP, anatomy of condition, FOTO and potential progress with skilled PT, POC    Person(s) Educated Patient    Methods Explanation;Demonstration;Tactile cues;Verbal cues;Handout    Comprehension Verbalized understanding;Returned demonstration;Verbal cues required;Tactile cues required;Need further instruction            PT Short Term Goals - 01/18/21 1656      PT SHORT TERM GOAL #1   Title Patient will be independent with initial HEP.    Baseline Provided initial HEP at evaluation 01/18/2021    Time 3    Period Weeks    Status New    Target Date 02/08/21      PT SHORT TERM GOAL #2   Title Patient will demonstrate L knee AROM 0-120 degrees to allow for improved gait and ability to navigate stairs.  Baseline 4-112 degrees L knee AROM    Time 3    Period Weeks    Status New    Target  Date 02/08/21      PT SHORT TERM GOAL #3   Title Patient will improve bilateral hip flexion >/= 4/5 grossly upon MMT assessment.    Baseline See flowsheet    Time 3    Period Weeks    Status New    Target Date 02/08/21      PT SHORT TERM GOAL #4   Title Patient will be able to ambulate >/= 500 feet on level ground without reporting feeling that someone is "jabbing" at his L knee.    Baseline Pt reports he will suddenly feel a "jabbing" at his L knee when walking    Time 3    Period Weeks    Status New    Target Date 02/08/21             PT Long Term Goals - 01/19/21 1516      PT LONG TERM GOAL #1   Title Patient will be independent with advanced HEP.    Baseline Provided initial HEP at evaluation 01/18/2021.    Time 6    Period Weeks    Status New    Target Date 03/01/21      PT LONG TERM GOAL #2   Title Patient will demonstrate L knee AROM at least 0-125 degrees for improved functional mobility.    Baseline 4-112 degrees L knee AROM    Time 6    Period Weeks    Status New    Target Date 03/01/21      PT LONG TERM GOAL #3   Title Patient will demonstrate bilateral hip FL >/= 4+/5 upon MMT assessment.    Baseline See flowsheet    Time 6    Period Weeks    Status New    Target Date 03/01/21      PT LONG TERM GOAL #4   Title Patient will be able to tolerate standing with L knee extended for at least 30 minutes without complaints of pain.    Baseline <1/10 pain upon arrival but notes increased pain when standing with L knee extended    Time 6    Period Weeks    Status New    Target Date 03/01/21      PT LONG TERM GOAL #5   Title Patient will improve 5xSTS from 22.18 seconds from low mat to </= 17 seconds to demonstrate improved functional mobility.    Baseline 22.18 seconds from low mat    Time 6    Period Weeks    Status New    Target Date 03/01/21      PT LONG TERM GOAL #6   Title Patient's FOTO score will increase from 56% to 70% functional status to  demonstrate improved perceived ability.    Baseline 56% function; predicted 70% function    Time 6    Period Weeks    Status New    Target Date 03/01/21                  Plan - 01/18/21 1652    Clinical Impression Statement Patient is a 60 year old male who presents to OPP[facility] with complaints of L knee pain with referral for L knee meniscus injury. He denies pain during L knee A/PROM or MMT aside from "little pulling" at end range L knee flexion today. He expresses  that when he does experience pain, it is typically due to a catching sensation or sudden pain when L knee is fully extended in weightbearing. He has modified mechanics to stepping and turning rather than pivoting when turning around frequently in standing to perform work tasks. He demonstrates bilateral weakness in hip flexors and limited L knee AROM (4-112 degrees). Though patient had no complaints of pain during interventions, he did fatigue quickly. LAQ attempted but held due to consistent popping with minimal to no pain every time patient perform L knee extension. When discussing POC, patient reports he has an appointment with surgeon next week and would like to attend that appointment and determine if he will be having surgery or not before scheduling further appointments. Instructed patient to call and follow up to schedule appointments or allow for D/C at this time. Pending decision regarding L knee surgery, patient should benefit from skilled PT to increase L knee ROM and functional strength to improve tolerance with daily and work activities.    Personal Factors and Comorbidities Age;Comorbidity 3+;Fitness;Past/Current Experience;Time since onset of injury/illness/exacerbation    Comorbidities See PMH above    Examination-Activity Limitations Bend;Dressing;Lift;Locomotion Level;Stairs;Stand;Squat;Transfers    Examination-Participation Restrictions Cleaning;Community Activity;Meal Prep;Shop    Stability/Clinical Decision Making  Stable/Uncomplicated    Clinical Decision Making Low    Rehab Potential Good    PT Frequency 1x / week    PT Duration 6 weeks    PT Treatment/Interventions ADLs/Self Care Home Management;Aquatic Therapy;Cryotherapy;Electrical Stimulation;Iontophoresis 4mg /ml Dexamethasone;Moist Heat;Ultrasound;Neuromuscular re-education;Balance training;Therapeutic exercise;Therapeutic activities;Functional mobility training;Stair training;Gait training;Patient/family education;Manual techniques;Energy conservation;Dry needling;Passive range of motion;Taping    PT Next Visit Plan LLE OKC and CKC strengthening activities within tolerance, manual techniques PRN    PT Home Exercise Plan [id number] - heel slide, STS, SLR    Consulted and Agree with Plan of Care Patient           Patient will benefit from skilled therapeutic intervention in order to improve the following deficits and impairments:  Decreased activity tolerance,Decreased balance,Decreased mobility,Decreased strength,Decreased endurance,Decreased range of motion,Difficulty walking,Pain,Improper body mechanics  Visit Diagnosis: Chronic pain of left knee  Stiffness of left knee, not elsewhere classified  Difficulty in walking, not elsewhere classified  Muscle weakness (generalized)     Problem List Patient Active Problem List   Diagnosis Date Noted  . Dental infection 11/29/2020  . Mild depression (HCC) 11/10/2020  . Family history of prostate cancer 11/10/2020  . Venous stasis dermatitis of both lower extremities 10/18/2019  . Left knee pain 05/22/2018  . 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 Acute paranoia (HCC) 09/08/2015  . Elevated coronary artery calcium score 02/04/2014  . Poor dentition 05/15/2013  . NSAID long-term use 12/05/2012  . TINNITUS, CHRONIC, BILATERAL 02/12/2009  . Hypothyroidism 08/28/2007  . Mixed hyperlipidemia 08/28/2007  . Cigarette smoker 08/28/2007    Rhea BleacherKajal Rheanna Sergent, PT,  DPT 01/19/21 3:29 PM  [facility] [address] [place], [place], [address] Phone: [phone]   Fax:  [phone]  Name: Joshua LusterDonald Mcknight MRN: [id number] Date of Birth: 07-22-1961

## 2021-01-26 DIAGNOSIS — M25562 Pain in left knee: Secondary | ICD-10-CM | POA: Diagnosis not present

## 2021-02-01 DIAGNOSIS — M25562 Pain in left knee: Secondary | ICD-10-CM | POA: Diagnosis not present

## 2021-02-04 DIAGNOSIS — M25562 Pain in left knee: Secondary | ICD-10-CM | POA: Diagnosis not present

## 2021-07-08 ENCOUNTER — Other Ambulatory Visit: Payer: Self-pay | Admitting: *Deleted

## 2021-07-08 DIAGNOSIS — F22 Delusional disorders: Secondary | ICD-10-CM

## 2021-07-11 MED ORDER — RISPERIDONE 2 MG PO TABS: 1.0000 mg | ORAL_TABLET | Freq: Two times a day (BID) | ORAL | 0 refills | Status: DC

## 2021-08-05 DIAGNOSIS — Z20822 Contact with and (suspected) exposure to covid-19: Secondary | ICD-10-CM | POA: Diagnosis not present

## 2021-08-05 DIAGNOSIS — R051 Acute cough: Secondary | ICD-10-CM | POA: Diagnosis not present

## 2021-08-05 DIAGNOSIS — J439 Emphysema, unspecified: Secondary | ICD-10-CM | POA: Diagnosis not present

## 2021-09-11 NOTE — Progress Notes (Signed)
    SUBJECTIVE:   CHIEF COMPLAINT / HPI:   Foot pain: Chronic left foot pain but new pain for about 1-1.5 months that starts from the time he wakes up until he gets home in the evenings. It is on the lateral aspect of his foot. He's taking 1,000mg  tylenol without much relief. No known trauma.   PERTINENT  PMH / PSH: None relevant  OBJECTIVE:   BP 132/74   Pulse 81   Ht 5\' 10"  (1.778 m)   Wt 162 lb 6.4 oz (73.7 kg)   SpO2 98%   BMI 23.30 kg/m    General: NAD, pleasant, able to participate in exam Respiratory: No respiratory distress MSK: Right ankle with no swelling or open lesions.  Good peripheral pulses in right lower extremity.  Fine touch sensation intact.  Full range of motion in all directions with right ankle.  Strength 5/5 in all directions.  He does have pain with palpation just anteriorly to the lateral malleolus.  He has no pain with palpation of the lateral malleolus.  No pain with palpation of the Achilles including the insertion of the Achilles tendon. Neuro: alert, no obvious focal deficits Psych: Normal affect and mood  ASSESSMENT/PLAN:   Right foot pain: Has been going on and off for some time but restarted about a month or month and a half ago.  Patient is on his feet 8 hours a day states he has not bought new work shoes in about 3 years.  He was ill earlier in the year and had to take some time off of work and during that time his bout of foot pain resolved.  He denies any trauma or injury to the foot.  On physical exam has good peripheral pulses, good sensation, normal strength in all ranges of motion.  His pain with palpation seems to be muscular and does not have any indication of a fracture.  We will plan to get x-rays of the right foot to evaluate for any obvious fracture or stress fracture or other bony abnormality.  Assuming this is negative his pain is likely due to soft tissue inflammation most likely secondary to his shoes.  Recommended Voltaren gel as well  as continuing acetaminophen.  Recommended physical therapy if the x-rays are negative.  I did discuss the possibility of using a surgical shoe or boot which he states he is not able to do at work and he has no ability to take time off of work until January due to his previous time off.   February, DO [facility]

## 2021-09-12 ENCOUNTER — Other Ambulatory Visit: Payer: Self-pay

## 2021-09-12 ENCOUNTER — Encounter: Payer: Self-pay | Admitting: Family Medicine

## 2021-09-12 ENCOUNTER — Ambulatory Visit (INDEPENDENT_AMBULATORY_CARE_PROVIDER_SITE_OTHER): Payer: BC Managed Care – PPO | Admitting: Family Medicine

## 2021-09-12 VITALS — BP 132/74 | HR 81 | Ht 70.0 in | Wt 162.4 lb

## 2021-09-12 DIAGNOSIS — M79671 Pain in right foot: Secondary | ICD-10-CM

## 2021-09-12 NOTE — Patient Instructions (Addendum)
I would like for you to get diclofenac gel which is also available Voltaren gel.  You can use this on your foot 4 times per day.  This can help relieve inflammation.  I have ordered an x-ray.  You can get this by going to the front entrance of [facility] and letting them know that you have an x-ray ordered by your doctor.  If you are able to take a few days off of work to rest her foot this may help it.  Continue with the Tylenol 1000 mg every 6-8 hours not to exceed 4000 mg/day.  If the x-ray does not show any fracture we can talk about physical therapy versus wearing a boot to take some stress off of your foot but ultimately I think a new pair of shoes will likely benefit you.

## 2021-09-14 ENCOUNTER — Ambulatory Visit (HOSPITAL_COMMUNITY)
Admission: RE | Admit: 2021-09-14 | Discharge: 2021-09-14 | Disposition: A | Discharge status: Home / Self Care | Payer: BC Managed Care – PPO | Source: Ambulatory Visit | Attending: Family Medicine | Admitting: Family Medicine

## 2021-09-14 ENCOUNTER — Other Ambulatory Visit: Payer: Self-pay

## 2021-09-14 DIAGNOSIS — M79671 Pain in right foot: Secondary | ICD-10-CM | POA: Insufficient documentation

## 2021-12-01 ENCOUNTER — Other Ambulatory Visit: Payer: Self-pay

## 2021-12-01 DIAGNOSIS — E890 Postprocedural hypothyroidism: Secondary | ICD-10-CM

## 2021-12-01 MED ORDER — LEVOTHYROXINE SODIUM 125 MCG PO TABS: 125.0000 ug | ORAL_TABLET | Freq: Every day | ORAL | 3 refills | Status: DC

## 2021-12-13 ENCOUNTER — Other Ambulatory Visit: Payer: Self-pay

## 2021-12-13 DIAGNOSIS — E7800 Pure hypercholesterolemia, unspecified: Secondary | ICD-10-CM

## 2021-12-14 MED ORDER — ATORVASTATIN CALCIUM 40 MG PO TABS: 40.0000 mg | ORAL_TABLET | Freq: Every day | ORAL | 3 refills | Status: DC

## 2022-01-28 ENCOUNTER — Emergency Department (HOSPITAL_BASED_OUTPATIENT_CLINIC_OR_DEPARTMENT_OTHER)
Admission: EM | Admit: 2022-01-28 | Discharge: 2022-01-28 | Disposition: A | Discharge status: Home / Self Care | Payer: BC Managed Care – PPO | Attending: Emergency Medicine | Admitting: Emergency Medicine

## 2022-01-28 ENCOUNTER — Encounter (HOSPITAL_BASED_OUTPATIENT_CLINIC_OR_DEPARTMENT_OTHER): Payer: Self-pay | Admitting: *Deleted

## 2022-01-28 ENCOUNTER — Other Ambulatory Visit: Payer: Self-pay

## 2022-01-28 DIAGNOSIS — K0889 Other specified disorders of teeth and supporting structures: Secondary | ICD-10-CM | POA: Diagnosis not present

## 2022-01-28 DIAGNOSIS — R519 Headache, unspecified: Secondary | ICD-10-CM | POA: Insufficient documentation

## 2022-01-28 DIAGNOSIS — G501 Atypical facial pain: Secondary | ICD-10-CM | POA: Diagnosis not present

## 2022-01-28 DIAGNOSIS — K029 Dental caries, unspecified: Secondary | ICD-10-CM | POA: Insufficient documentation

## 2022-01-28 MED ORDER — IBUPROFEN 800 MG PO TABS: 800.0000 mg | ORAL_TABLET | Freq: Once | ORAL | Status: DC

## 2022-01-28 MED ORDER — PENICILLIN V POTASSIUM 500 MG PO TABS: 500.0000 mg | ORAL_TABLET | Freq: Three times a day (TID) | ORAL | 0 refills | Status: DC

## 2022-01-28 NOTE — ED Notes (Signed)
Dc instructions reviewed with patient. Patient voiced understanding. Dc with belongings.  °

## 2022-01-28 NOTE — ED Provider Notes (Signed)
?[facility]PT ?Provider Note ? ? ?CSN: [id number] ?Arrival date & time: 01/28/22  1306 ? ?  ? ?History ? ?Chief Complaint  ?Patient presents with  ? Facial Pain  ? ? ?Joshua Mcknight is a 61 y.o. male. ? ?HPI ?61 year old male presents today complaining of some right facial pain and upper dental pain.  He has not had any nasal discharge.  He has not had fever or chills.  He feels that there is some swelling and over the right maxillary sinus and initially thought he had sinusitis.  However, he also endorses he has some pain in his teeth and has very poor dentition in the right upper ? ?  ? ?Home Medications ?Prior to Admission medications   ?Medication Sig Start Date End Date Taking? Authorizing Provider  ?penicillin v potassium (VEETID) 500 MG tablet Take 1 tablet (500 mg total) by mouth 3 (three) times daily. 01/28/22  Yes Margarita Grizzle, MD  ?albuterol (VENTOLIN HFA) 108 (90 Base) MCG/ACT inhaler Inhale 2 puffs into the lungs every 4 (four) hours as needed for wheezing or shortness of breath. ?Patient not taking: Reported on 01/18/2021 12/22/19   Arlyce Harman, MD  ?atorvastatin (LIPITOR) 40 MG tablet Take 1 tablet (40 mg total) by mouth daily. 12/14/21   Autry-Lott, Randa Evens, DO  ?bacitracin ointment Apply 1 application topically 2 (two) times daily. 01/03/19   Eustace Moore, MD  ?levothyroxine (SYNTHROID) 125 MCG tablet Take 1 tablet (125 mcg total) by mouth daily before breakfast. 12/01/21   Autry-Lott, Randa Evens, DO  ?risperiDONE (RISPERDAL) 2 MG tablet Take 0.5 tablets (1 mg total) by mouth 2 (two) times daily. 07/11/21   Autry-Lott, Randa Evens, DO  ?   ? ?Allergies    ?Food   ? ?Review of Systems   ?Review of Systems  ?All other systems reviewed and are negative. ? ?Physical Exam ?Updated Vital Signs ?BP 134/75   Pulse 75   Temp 98.5 ?F (36.9 ?C)   Resp 16   SpO2 98%  ?Physical Exam ?Vitals and nursing note reviewed.  ?Constitutional:   ?   Appearance: He is well-developed.  ?HENT:  ?   Head:  Normocephalic and atraumatic.  ?   Right Ear: External ear normal.  ?   Left Ear: External ear normal.  ?   Nose: Nose normal.  ?   Mouth/Throat:  ?   Mouth: Mucous membranes are moist.  ?   Pharynx: Oropharynx is clear.  ?   Comments: Diffuse poor dentition with mild tenderness above decayed root of right upper incisor ?However, no obvious fluctuance noted ?No redness noted of her face ?Eyes:  ?   Extraocular Movements: Extraocular movements intact.  ?Neck:  ?   Trachea: No tracheal deviation.  ?Cardiovascular:  ?   Rate and Rhythm: Normal rate.  ?   Pulses: Normal pulses.  ?Pulmonary:  ?   Effort: Pulmonary effort is normal.  ?Musculoskeletal:     ?   General: Normal range of motion.  ?   Cervical back: Normal range of motion.  ?Skin: ?   General: Skin is warm and dry.  ?Neurological:  ?   Mental Status: He is alert and oriented to person, place, and time.  ?Psychiatric:     ?   Mood and Affect: Mood normal.     ?   Behavior: Behavior normal.  ? ? ?ED Results / Procedures / Treatments   ?Labs ?(all labs ordered are listed, but only abnormal results are displayed) ?Labs  Reviewed - No data to display ? ?EKG ?None ? ?Radiology ?No results found. ? ?Procedures ?Procedures  ? ? ?Medications Ordered in ED ?Medications  ?ibuprofen (ADVIL) tablet 800 mg (has no administration in time range)  ? ? ?ED Course/ Medical Decision Making/ A&P ?  ?                        ?Medical Decision Making ?61 year old male with some pain in his right face.  Has diffuse poor dentition.  Plan nonsteroidal anti-inflammatories and penicillin. ?Discussed return precautions and need for follow-up and patient voices understanding. ? ? ? ? ? ? ? ? ? ? ?Final Clinical Impression(s) / ED Diagnoses ?Final diagnoses:  ?Facial pain  ?Dental caries  ? ? ?Rx / DC Orders ?ED Discharge Orders   ? ?      Ordered  ?  penicillin v potassium (VEETID) 500 MG tablet  3 times daily       ? 01/28/22 1426  ? ?  ?  ? ?  ? ? ?  ?Margarita Grizzle, MD ?01/28/22 1428 ? ?

## 2022-01-28 NOTE — ED Triage Notes (Signed)
Pt is here for facial pain in area of maxillary sinus.  Pt also has poor dentition with significant dental decay.  Pt states that this has been going on for a few days and he feels he is getting a sinus infection ?

## 2022-01-28 NOTE — Discharge Instructions (Addendum)
Please take antibiotics as prescribed ?Call Dr. Drinda Butts office on Monday for follow-up ?Return if you are having any worsening symptoms especially increased redness, fever, chills or inability to tolerate your antibiotics ?Please use ibuprofen 400 to 800 mg every 6-8 hours and you may alternate this with acetaminophen 650 mg every 4 hours for pain ?

## 2022-02-28 ENCOUNTER — Other Ambulatory Visit: Payer: Self-pay | Admitting: Family Medicine

## 2022-02-28 DIAGNOSIS — F22 Delusional disorders: Secondary | ICD-10-CM

## 2022-04-04 ENCOUNTER — Encounter: Payer: Self-pay | Admitting: *Deleted

## 2022-06-07 ENCOUNTER — Other Ambulatory Visit: Payer: Self-pay | Admitting: Family Medicine

## 2022-06-07 DIAGNOSIS — F22 Delusional disorders: Secondary | ICD-10-CM

## 2022-08-23 ENCOUNTER — Other Ambulatory Visit: Payer: Self-pay | Admitting: Family Medicine

## 2022-08-23 DIAGNOSIS — F22 Delusional disorders: Secondary | ICD-10-CM

## 2022-08-25 MED ORDER — RISPERIDONE 2 MG PO TABS: 1.0000 mg | ORAL_TABLET | Freq: Two times a day (BID) | ORAL | 0 refills | Status: DC

## 2023-01-25 ENCOUNTER — Other Ambulatory Visit: Payer: Self-pay | Admitting: Family Medicine

## 2023-01-25 DIAGNOSIS — E890 Postprocedural hypothyroidism: Secondary | ICD-10-CM

## 2023-01-29 ENCOUNTER — Other Ambulatory Visit: Payer: Self-pay | Admitting: Family Medicine

## 2023-01-29 ENCOUNTER — Other Ambulatory Visit: Payer: Self-pay | Admitting: *Deleted

## 2023-01-29 DIAGNOSIS — E890 Postprocedural hypothyroidism: Secondary | ICD-10-CM

## 2023-01-29 DIAGNOSIS — F22 Delusional disorders: Secondary | ICD-10-CM

## 2023-01-29 MED ORDER — LEVOTHYROXINE SODIUM 125 MCG PO TABS: 125.0000 ug | ORAL_TABLET | Freq: Every day | ORAL | 3 refills | Status: DC

## 2024-05-07 ENCOUNTER — Ambulatory Visit (HOSPITAL_COMMUNITY)
Admission: EM | Admit: 2024-05-07 | Discharge: 2024-05-07 | Disposition: A | Discharge status: Home / Self Care | Payer: Self-pay

## 2024-05-07 ENCOUNTER — Encounter (HOSPITAL_COMMUNITY): Payer: Self-pay

## 2024-05-07 VITALS — BP 133/81 | HR 69 | Temp 97.8°F | Resp 16

## 2024-05-07 DIAGNOSIS — E890 Postprocedural hypothyroidism: Secondary | ICD-10-CM | POA: Insufficient documentation

## 2024-05-07 DIAGNOSIS — Z760 Encounter for issue of repeat prescription: Secondary | ICD-10-CM | POA: Insufficient documentation

## 2024-05-07 DIAGNOSIS — E7800 Pure hypercholesterolemia, unspecified: Secondary | ICD-10-CM | POA: Insufficient documentation

## 2024-05-07 LAB — BASIC METABOLIC PANEL WITH GFR
Anion gap: 10 (ref 5–15)
BUN: 5 mg/dL — ABNORMAL LOW (ref 8–23)
CO2: 26 mmol/L (ref 22–32)
Calcium: 9.6 mg/dL (ref 8.9–10.3)
Chloride: 100 mmol/L (ref 98–111)
Creatinine, Ser: 0.89 mg/dL (ref 0.61–1.24)
GFR, Estimated: >60 mL/min (ref >60)
Glucose, Bld: 86 mg/dL (ref 70–99)
Potassium: 4.9 mmol/L (ref 3.5–5.1)
Sodium: 136 mmol/L (ref 135–145)

## 2024-05-07 LAB — CBC
HCT: 46.2 % (ref 39.0–52.0)
Hemoglobin: 15.4 g/dL (ref 13.0–17.0)
MCH: 28.3 pg (ref 26.0–34.0)
MCHC: 33.3 g/dL (ref 30.0–36.0)
MCV: 84.9 fL (ref 80.0–100.0)
Platelets: 301 K/uL (ref 150–400)
RBC: 5.44 MIL/uL (ref 4.22–5.81)
RDW: 12.7 % (ref 11.5–15.5)
WBC: 7.4 K/uL (ref 4.0–10.5)
nRBC: 0 % (ref 0.0–0.2)

## 2024-05-07 LAB — TSH: TSH: 0.495 u[IU]/mL (ref 0.350–4.500)

## 2024-05-07 LAB — T4, FREE: Free T4: 1.08 ng/dL (ref 0.61–1.12)

## 2024-05-07 MED ORDER — ATORVASTATIN CALCIUM 40 MG PO TABS: 40.0000 mg | ORAL_TABLET | Freq: Every day | ORAL | 3 refills | Status: AC

## 2024-05-07 MED ORDER — LEVOTHYROXINE SODIUM 125 MCG PO TABS: 125.0000 ug | ORAL_TABLET | Freq: Every day | ORAL | 3 refills | Status: AC

## 2024-05-07 MED ORDER — ALBUTEROL SULFATE HFA 108 (90 BASE) MCG/ACT IN AERS: 2.0000 | INHALATION_SPRAY | RESPIRATORY_TRACT | 2 refills | Status: AC | PRN

## 2024-05-07 NOTE — ED Triage Notes (Signed)
 Patient here today for a refill on his Levothyroxine . Patient states that he doesn't have insurance and without a PCP until next year when he gets Medicare.

## 2024-05-07 NOTE — Discharge Instructions (Signed)
  1. Medication refill (Primary) - levothyroxine  (SYNTHROID ) 125 MCG tablet; Take 1 tablet (125 mcg total) by mouth daily before breakfast.  Dispense: 90 tablet; Refill: 3 - atorvastatin  (LIPITOR) 40 MG tablet; Take 1 tablet (40 mg total) by mouth daily.  Dispense: 90 tablet; Refill: 3 - albuterol  (VENTOLIN  HFA) 108 (90 Base) MCG/ACT inhaler; Inhale 2 puffs into the lungs every 4 (four) hours as needed for wheezing or shortness of breath.  Dispense: 18 g; Refill: 2 - CBC - Basic metabolic panel - TSH - T4, free - Lab specimens collected in UC and sent to lab for further testing results should be available in 1 to 2 days. If any abnormality noted and lab report patient will be contacted appropriate treatment provided. -Continue to monitor symptoms for any change in severity if there is any escalation of current symptoms or development of new symptoms follow-up in ER for further evaluation and management.

## 2024-05-07 NOTE — ED Provider Notes (Signed)
 [facility]  Note:  This document was prepared using Dragon voice recognition software and may include unintentional dictation errors.  MRN: [id number] DOB: 1961/02/24  Subjective:   Joshua Mcknight is a 63 y.o. male presenting for a medication refill of his levothyroxine  125 mcg.  Patient reports that he ran out of medication this morning and does not have insurance or PCP.  Patient reports that he is unable to get on Medicare until next June.  Patient has been taking levothyroxine  for approximately 30 years without any problems.  Patient reports most recent laboratory work was last July, when he was prescribed medication previously.  Patient reports that he is just waiting to turn 64 and get started on Medicare so he can establish care and continue medication treatment.  Patient reports no current medical concerns or symptoms.  No current facility-administered medications for this encounter.  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      Past Medical History:  Diagnosis Date   Emphysema of lung (HCC)    Hyperlipidemia    Thyroid  disease      Past Surgical History:  Procedure Laterality Date   KNEE SURGERY Left age 27    Family History  Problem Relation Age of Onset   CAD Father        CABG at 65   Aneurysm Father    Hyperlipidemia Mother    Hypertension Mother    Edema Mother    Other Brother        artery blockage   Bronchitis Brother     Diabetes Mellitus I Brother     Social History   Tobacco Use   Smoking status: Every Day    Current packs/day: 1.00    Average packs/day: 1 pack/day for 45.0 years (45.0 ttl pk-yrs)    Types: Cigarettes    Start date: 10/31/1979   Smokeless tobacco: Never   Tobacco comments:    Quit X2  Max PPD = 2 for 20 years then 1PPD  Substance Use Topics   Alcohol use: Yes    Comment: occasionally   Drug use: No    ROS Refer to HPI for ROS details.  Objective:   Vitals: BP 133/81 (BP Location: Right Arm)   Pulse 69   Temp 97.8 F (36.6 C) (Oral)   Resp 16   SpO2 97%   Physical Exam Vitals and nursing note reviewed.  Constitutional:      General: He is not in acute distress.    Appearance: He is well-developed. He is not ill-appearing or toxic-appearing.  HENT:     Head: Normocephalic.  Cardiovascular:     Rate and Rhythm: Normal rate.  Pulmonary:     Effort: Pulmonary effort is normal. No respiratory distress.  Skin:    General: Skin is warm and dry.  Neurological:     General: No focal deficit present.     Mental Status: He  is alert and oriented to person, place, and time.  Psychiatric:        Mood and Affect: Mood normal.        Behavior: Behavior normal.     Procedures  No results found for this or any previous visit (from the past 24 hours).  No results found.   Assessment and Plan :     Discharge Instructions       1. Medication refill (Primary) - levothyroxine  (SYNTHROID ) 125 MCG tablet; Take 1 tablet (125 mcg total) by mouth daily before breakfast.  Dispense: 90 tablet; Refill: 3 - atorvastatin  (LIPITOR) 40 MG tablet; Take 1 tablet (40 mg total) by mouth daily.  Dispense: 90 tablet; Refill: 3 - albuterol  (VENTOLIN  HFA) 108 (90 Base) MCG/ACT inhaler; Inhale 2 puffs into the lungs every 4 (four) hours as needed for wheezing or shortness of breath.  Dispense: 18 g; Refill: 2 - CBC - Basic metabolic panel - TSH - T4, free - Lab specimens collected in  UC and sent to lab for further testing results should be available in 1 to 2 days. If any abnormality noted and lab report patient will be contacted appropriate treatment provided. -Continue to monitor symptoms for any change in severity if there is any escalation of current symptoms or development of new symptoms follow-up in ER for further evaluation and management.      Danyiel Crespin B [facility] Merriott   Jovee Dettinger, [place] B, [place] 05/07/24 1056

## 2024-05-08 ENCOUNTER — Ambulatory Visit (HOSPITAL_COMMUNITY): Payer: Self-pay

## 2024-08-30 DEATH — deceased

## 2024-12-05 ENCOUNTER — Ambulatory Visit: Payer: Self-pay | Admitting: Family Medicine

## 2024-12-05 ENCOUNTER — Encounter: Payer: Self-pay | Admitting: Family Medicine

## 2024-12-05 VITALS — BP 124/80 | HR 74 | Ht 70.0 in | Wt 129.2 lb

## 2024-12-05 DIAGNOSIS — G629 Polyneuropathy, unspecified: Secondary | ICD-10-CM

## 2024-12-05 DIAGNOSIS — Z720 Tobacco use: Secondary | ICD-10-CM

## 2024-12-05 MED ORDER — GABAPENTIN 100 MG PO CAPS: 100.0000 mg | ORAL_CAPSULE | Freq: Two times a day (BID) | ORAL | 3 refills | Status: AC

## 2024-12-05 NOTE — Patient Instructions (Addendum)
 1) For you leg pain, it is most likely from Venous Stasis dermatitis.  - Twice a day, apply a lotion such as Cetaphil or Aveeno. Then apply a thick layer of Vaseline on top of the lotion. This is to protect your skin and allow it to heal.  2) For the pain in your legs,  - Start taking tylenol  1000mg  2-3 times a day. This is safe and has low side effect risks. - Start taking Gabapentin  100mg  at bedtime. This can make you drowsy. If it is not enough, you can increase to 2 tablets at bedtime.  - For your aspirin, switch to taking 1 baby aspirin (81mg ) a day. This is to decrease your risk of blood clots. - Try to avoid ibuprofen . You can take it occasionally, but try to avoid consistent use.   3) We will order a CT scan of your lungs to look for lung cancer. Your weight has dropped over the past few years so I think it would be helpful to look. They will call you to schedule this in the next few weeks.

## 2024-12-05 NOTE — Progress Notes (Unsigned)
" ° ° °  SUBJECTIVE:   CHIEF COMPLAINT / HPI:   ***  Has not been seen in clinic since 2022. Here to re-establish care.   PMHx - back pain - COPD - HLD - hypothyroidism (s/p radiation for Graves dz) - no known hx of malignancy  Surgery Hx - 1982 L knee surgery  Fam Hx: - Mom: no known health issues, still alive 24 - Dad: passed at 36, died from brain aneurysm.   Meds - levothyroxine   Social Hx - tobacco: started since 12, smoking 1ppd - alcohol: once a month - Lives with wife. - Retired 3 years, used to work in psychiatric nurse.  Back Pain - Having chronic low back pain     BL LE from shin down has erythema   PERTINENT  PMH / PSH: COPD, hypothyroidism, HLD  OBJECTIVE:   There were no vitals taken for this visit.  ***      BL LE from shin down has erythema    ASSESSMENT/PLAN:   Assessment & Plan    Most recent 05/07/2024 CBC, BMP, TSH, T4 wnl.        Twyla Nearing, MD [facility] "
